# Patient Record
Sex: Male | Born: 1960 | Race: Black or African American | Hispanic: No | Marital: Married | Smoking: Never smoker
Health system: Southern US, Community
[De-identification: ages and names within clinical notes are randomized; demographics above are authoritative.]

## PROBLEM LIST (undated history)

## (undated) DIAGNOSIS — I422 Other hypertrophic cardiomyopathy: Secondary | ICD-10-CM

## (undated) DIAGNOSIS — N529 Male erectile dysfunction, unspecified: Secondary | ICD-10-CM

## (undated) DIAGNOSIS — I10 Essential (primary) hypertension: Secondary | ICD-10-CM

## (undated) DIAGNOSIS — M109 Gout, unspecified: Secondary | ICD-10-CM

## (undated) DIAGNOSIS — M5116 Intervertebral disc disorders with radiculopathy, lumbar region: Secondary | ICD-10-CM

## (undated) DIAGNOSIS — E785 Hyperlipidemia, unspecified: Secondary | ICD-10-CM

## (undated) DIAGNOSIS — G4730 Sleep apnea, unspecified: Secondary | ICD-10-CM

## (undated) DIAGNOSIS — I77810 Thoracic aortic ectasia: Secondary | ICD-10-CM

## (undated) DIAGNOSIS — R011 Cardiac murmur, unspecified: Secondary | ICD-10-CM

## (undated) DIAGNOSIS — E288 Other ovarian dysfunction: Secondary | ICD-10-CM

## (undated) DIAGNOSIS — I2510 Atherosclerotic heart disease of native coronary artery without angina pectoris: Secondary | ICD-10-CM

## (undated) DIAGNOSIS — I5189 Other ill-defined heart diseases: Secondary | ICD-10-CM

## (undated) HISTORY — PX: CARDIAC CATHETERIZATION: SHX172

## (undated) HISTORY — PX: TONSILLECTOMY AND ADENOIDECTOMY: SUR1326

## (undated) HISTORY — DX: Other hypertrophic cardiomyopathy: I42.2

## (undated) HISTORY — PX: TONSILLECTOMY: SUR1361

## (undated) HISTORY — PX: CHOANAL ADENIODECTOMY: SHX1340

---

## 2004-04-08 ENCOUNTER — Inpatient Hospital Stay: Payer: Self-pay | Admitting: Otolaryngology

## 2010-05-01 ENCOUNTER — Ambulatory Visit: Payer: Self-pay | Admitting: Nurse Practitioner

## 2012-06-25 ENCOUNTER — Emergency Department: Payer: Self-pay | Admitting: Unknown Physician Specialty

## 2012-06-25 LAB — CBC
HCT: 45.6 % (ref 40.0–52.0)
HGB: 15.2 g/dL (ref 13.0–18.0)
MCH: 29.2 pg (ref 26.0–34.0)
MCV: 88 fL (ref 80–100)
WBC: 11.7 10*3/uL — ABNORMAL HIGH (ref 3.8–10.6)

## 2012-06-25 LAB — COMPREHENSIVE METABOLIC PANEL
Albumin: 4.1 g/dL (ref 3.4–5.0)
BUN: 17 mg/dL (ref 7–18)
Calcium, Total: 9.1 mg/dL (ref 8.5–10.1)
Chloride: 108 mmol/L — ABNORMAL HIGH (ref 98–107)
Co2: 26 mmol/L (ref 21–32)
EGFR (African American): >60
EGFR (Non-African Amer.): >60
Potassium: 3.6 mmol/L (ref 3.5–5.1)
SGOT(AST): 23 U/L (ref 15–37)

## 2012-06-25 LAB — URINALYSIS, COMPLETE
Bacteria: NONE SEEN
Bilirubin,UR: NEGATIVE
Glucose,UR: NEGATIVE mg/dL (ref 0–75)
Ketone: NEGATIVE
Nitrite: NEGATIVE
Ph: 5 (ref 4.5–8.0)
Protein: 30
RBC,UR: 1 /HPF (ref 0–5)
Specific Gravity: 1.027 (ref 1.003–1.030)
Squamous Epithelial: <1
WBC UR: 2 /HPF (ref 0–5)

## 2012-06-25 LAB — LIPASE, BLOOD: Lipase: 111 U/L (ref 73–393)

## 2014-03-16 DIAGNOSIS — I10 Essential (primary) hypertension: Secondary | ICD-10-CM

## 2014-03-16 HISTORY — DX: Essential (primary) hypertension: I10

## 2014-04-13 DIAGNOSIS — M109 Gout, unspecified: Secondary | ICD-10-CM

## 2014-04-13 HISTORY — DX: Gout, unspecified: M10.9

## 2014-05-14 DIAGNOSIS — N529 Male erectile dysfunction, unspecified: Secondary | ICD-10-CM

## 2014-05-14 HISTORY — DX: Male erectile dysfunction, unspecified: N52.9

## 2015-05-10 ENCOUNTER — Encounter: Payer: Self-pay | Admitting: *Deleted

## 2015-05-13 ENCOUNTER — Encounter
Admission: RE | Disposition: A | Discharge status: Home / Self Care | Payer: Self-pay | Source: Ambulatory Visit | Attending: Gastroenterology

## 2015-05-13 ENCOUNTER — Ambulatory Visit
Admission: RE | Admit: 2015-05-13 | Discharge: 2015-05-13 | Disposition: A | Discharge status: Home / Self Care | Payer: BC Managed Care – PPO | Source: Ambulatory Visit | Attending: Gastroenterology | Admitting: Gastroenterology

## 2015-05-13 ENCOUNTER — Ambulatory Visit: Payer: BC Managed Care – PPO | Admitting: Anesthesiology

## 2015-05-13 ENCOUNTER — Encounter: Payer: Self-pay | Admitting: Anesthesiology

## 2015-05-13 DIAGNOSIS — G4730 Sleep apnea, unspecified: Secondary | ICD-10-CM | POA: Diagnosis not present

## 2015-05-13 DIAGNOSIS — M109 Gout, unspecified: Secondary | ICD-10-CM | POA: Diagnosis not present

## 2015-05-13 DIAGNOSIS — Z8371 Family history of colonic polyps: Secondary | ICD-10-CM | POA: Insufficient documentation

## 2015-05-13 DIAGNOSIS — Z1211 Encounter for screening for malignant neoplasm of colon: Secondary | ICD-10-CM | POA: Insufficient documentation

## 2015-05-13 DIAGNOSIS — I10 Essential (primary) hypertension: Secondary | ICD-10-CM | POA: Insufficient documentation

## 2015-05-13 DIAGNOSIS — Z800 Family history of malignant neoplasm of digestive organs: Secondary | ICD-10-CM | POA: Insufficient documentation

## 2015-05-13 DIAGNOSIS — K640 First degree hemorrhoids: Secondary | ICD-10-CM | POA: Diagnosis not present

## 2015-05-13 HISTORY — DX: Essential (primary) hypertension: I10

## 2015-05-13 HISTORY — DX: Sleep apnea, unspecified: G47.30

## 2015-05-13 HISTORY — DX: Other ovarian dysfunction: E28.8

## 2015-05-13 HISTORY — DX: Gout, unspecified: M10.9

## 2015-05-13 HISTORY — PX: COLONOSCOPY WITH PROPOFOL: SHX5780

## 2015-05-13 HISTORY — DX: Male erectile dysfunction, unspecified: N52.9

## 2015-05-13 SURGERY — COLONOSCOPY WITH PROPOFOL
Anesthesia: General

## 2015-05-13 MED ORDER — FENTANYL CITRATE (PF) 100 MCG/2ML IJ SOLN
INTRAMUSCULAR | Status: DC | PRN
  Administered 2015-05-13: 50 ug via INTRAVENOUS

## 2015-05-13 MED ORDER — PROPOFOL 500 MG/50ML IV EMUL
INTRAVENOUS | Status: DC | PRN
  Administered 2015-05-13: 140 ug/kg/min via INTRAVENOUS

## 2015-05-13 MED ORDER — MIDAZOLAM HCL 5 MG/5ML IJ SOLN
INTRAMUSCULAR | Status: DC | PRN
  Administered 2015-05-13: 2 mg via INTRAVENOUS

## 2015-05-13 MED ORDER — PROPOFOL 10 MG/ML IV BOLUS
INTRAVENOUS | Status: DC | PRN
  Administered 2015-05-13: 70 mg via INTRAVENOUS

## 2015-05-13 MED ORDER — LIDOCAINE HCL (CARDIAC) 20 MG/ML IV SOLN
INTRAVENOUS | Status: DC | PRN
  Administered 2015-05-13: 40 mg via INTRAVENOUS

## 2015-05-13 MED ORDER — SODIUM CHLORIDE 0.9 % IV SOLN
INTRAVENOUS | Status: DC
  Administered 2015-05-13: 1000 mL via INTRAVENOUS

## 2015-05-13 MED ORDER — SODIUM CHLORIDE 0.9 % IV SOLN: INTRAVENOUS | Status: DC

## 2015-05-13 NOTE — Anesthesia Preprocedure Evaluation (Signed)
Anesthesia Evaluation  Patient identified by MRN, date of birth, ID band Patient awake    Reviewed: Allergy & Precautions, NPO status , Patient's Chart, lab work & pertinent test results, reviewed documented beta blocker date and time   Airway Mallampati: II  TM Distance: >3 FB     Dental  (+) Chipped   Pulmonary          Cardiovascular hypertension, Pt. on medications     Neuro/Psych    GI/Hepatic   Endo/Other    Renal/GU      Musculoskeletal   Abdominal   Peds  Hematology   Anesthesia Other Findings   Reproductive/Obstetrics                             Anesthesia Physical Anesthesia Plan  ASA: II  Anesthesia Plan: General   Post-op Pain Management:    Induction: Intravenous  Airway Management Planned: Nasal Cannula  Additional Equipment:   Intra-op Plan:   Post-operative Plan:   Informed Consent: I have reviewed the patients History and Physical, chart, labs and discussed the procedure including the risks, benefits and alternatives for the proposed anesthesia with the patient or authorized representative who has indicated his/her understanding and acceptance.     Plan Discussed with: CRNA  Anesthesia Plan Comments:         Anesthesia Quick Evaluation  

## 2015-05-13 NOTE — Transfer of Care (Signed)
Immediate Anesthesia Transfer of Care Note  Patient: Brady Johnson  Procedure(s) Performed: Procedure(s): COLONOSCOPY WITH PROPOFOL (N/A)  Patient Location: PACU  Anesthesia Type:General  Level of Consciousness: sedated  Airway & Oxygen Therapy: Patient Spontanous Breathing and Patient connected to nasal cannula oxygen  Post-op Assessment: Report given to RN and Post -op Vital signs reviewed and stable  Post vital signs: Reviewed and stable  Last Vitals:  Filed Vitals:   05/13/15 1103  BP: 150/99  Pulse: 70  Temp: 36 C  Resp: 16    Complications: No apparent anesthesia complications

## 2015-05-13 NOTE — Op Note (Signed)
[facility] Gastroenterology Patient Name: Brady Johnson Procedure Date: 05/13/2015 12:38 PM MRN: [id number] Account #: [id number] Date of Birth: 05-Sep-1960 Admit Type: Outpatient Age: 55 Room: [facility] ENDO ROOM 3 Gender: Male Note Status: Finalized Procedure:         Colonoscopy Indications:       Family history of colon cancer in a distant relative,                     Family history of colonic polyps in a first-degree relative Providers:         Christena Deem, MD Referring MD:      [name] (Referring MD) Medicines:         Monitored Anesthesia Care Complications:     No immediate complications. Procedure:         Pre-Anesthesia Assessment:                    - ASA Grade Assessment: II - A patient with mild systemic                     disease.                    After obtaining informed consent, the colonoscope was                     passed under direct vision. Throughout the procedure, the                     patient's blood pressure, pulse, and oxygen saturations                     were monitored continuously. The Colonoscope was                     introduced through the anus and advanced to the the cecum,                     identified by appendiceal orifice and ileocecal valve. The                     colonoscopy was performed without difficulty. The                     colonoscopy was performed with moderate difficulty due to                     significant looping. Successful completion of the                     procedure was aided by changing the patient to a supine                     position, changing the patient to a prone position and                     using manual pressure. The quality of the bowel                     preparation was good. Findings:      Non-bleeding internal hemorrhoids were found during anoscopy. The       hemorrhoids were Grade I (internal hemorrhoids that do not prolapse).      The retroflexed view of the  distal rectum and anal  verge was normal and       showed no anal or rectal abnormalities.      The exam was otherwise without abnormality. Impression:        - Non-bleeding internal hemorrhoids.                    - The distal rectum and anal verge are normal on                     retroflexion view.                    - The examination was otherwise normal.                    - No specimens collected. Recommendation:    - Discharge patient to home.                    - Repeat colonoscopy in 5 years for screening purposes. Procedure Code(s): --- Professional ---                    [id number], Colonoscopy, flexible; diagnostic, including                     collection of specimen(s) by brushing or washing, when                     performed (separate procedure) Diagnosis Code(s): --- Professional ---                    K64.0, First degree hemorrhoids                    Z80.0, Family history of malignant neoplasm of digestive                     organs                    Z83.71, Family history of colonic polyps CPT copyright 2014 [employer]. All rights reserved. The codes documented in this report are preliminary and upon coder review may  be revised to meet current compliance requirements. Christena Deem, MD 05/13/2015 1:15:33 PM This report has been signed electronically. Number of Addenda: 0 Note Initiated On: 05/13/2015 12:38 PM Scope Withdrawal Time: 0 hours 5 minutes 53 seconds  Total Procedure Duration: 0 hours 26 minutes 25 seconds       [facility]

## 2015-05-13 NOTE — Anesthesia Postprocedure Evaluation (Signed)
Anesthesia Post Note  Patient: Brady Johnson  Procedure(s) Performed: Procedure(s) (LRB): COLONOSCOPY WITH PROPOFOL (N/A)  Patient location during evaluation: Endoscopy Anesthesia Type: General Level of consciousness: awake Pain management: pain level controlled Vital Signs Assessment: post-procedure vital signs reviewed and stable Respiratory status: spontaneous breathing Cardiovascular status: blood pressure returned to baseline Anesthetic complications: no    Last Vitals:  Filed Vitals:   05/13/15 1340 05/13/15 1350  BP: 125/86 143/99  Pulse: 85 70  Temp:    Resp: 23 13    Last Pain: There were no vitals filed for this visit.               Tishia Maestre S

## 2015-05-13 NOTE — H&P (Signed)
Outpatient short stay form Pre-procedure 05/13/2015 12:37 PM Christena Deem MD  Primary Physician: [facility]  Reason for visit:  Colonoscopy  History of present illness:  Patient is a 55 year old male presenting today for screening colonoscopy. This is his first colonoscopy. There is a family history of colon polyps in his mother. Ulcerating maternal uncle who had colon cancer.  He tolerated his prep well. He takes no aspirin or blood thinning agents.    Current facility-administered medications:  .  0.9 %  sodium chloride infusion, , Intravenous, Continuous, Christena Deem, MD, Last Rate: 20 mL/hr at 05/13/15 1118, 1,000 mL at 05/13/15 1118 .  0.9 %  sodium chloride infusion, , Intravenous, Continuous, Christena Deem, MD  Prescriptions prior to admission  Medication Sig Dispense Refill Last Dose  . Colchicine 0.6 MG CAPS Take 1 tablet by mouth daily.     Marland Kitchen lisinopril (PRINIVIL,ZESTRIL) 30 MG tablet Take 30 mg by mouth daily.        No Known Allergies   Past Medical History  Diagnosis Date  . Hypertension   . Erectile dysfunction   . Hyperandrogenism   . Gout   . Sleep apnea     Review of systems:      Physical Exam    Heart and lungs: Regular rate and rhythm without rub or gallop, lungs are bilaterally clear.    HEENT: Normocephalic atraumatic eyes are anicteric    Other:     Pertinant exam for procedure: Soft nontender nondistended bowel sounds positive normoactive.    Planned proceedures: Colonoscopy with indicated procedures. I have discussed the risks benefits and complications of procedures to include not limited to bleeding, infection, perforation and the risk of sedation and the patient wishes to proceed.    Christena Deem, MD Gastroenterology 05/13/2015  12:37 PM

## 2015-05-14 ENCOUNTER — Encounter: Payer: Self-pay | Admitting: Gastroenterology

## 2016-04-13 DIAGNOSIS — E785 Hyperlipidemia, unspecified: Secondary | ICD-10-CM

## 2016-04-13 HISTORY — DX: Hyperlipidemia, unspecified: E78.5

## 2017-02-07 ENCOUNTER — Ambulatory Visit
Admission: EM | Admit: 2017-02-07 | Discharge: 2017-02-07 | Disposition: A | Discharge status: Home / Self Care | Payer: BC Managed Care – PPO | Attending: Family Medicine | Admitting: Family Medicine

## 2017-02-07 DIAGNOSIS — M109 Gout, unspecified: Secondary | ICD-10-CM | POA: Diagnosis not present

## 2017-02-07 DIAGNOSIS — M25572 Pain in left ankle and joints of left foot: Secondary | ICD-10-CM

## 2017-02-07 DIAGNOSIS — M79672 Pain in left foot: Secondary | ICD-10-CM | POA: Diagnosis not present

## 2017-02-07 MED ORDER — COLCHICINE 0.6 MG PO TABS: ORAL_TABLET | ORAL | 1 refills | Status: DC

## 2017-02-07 NOTE — ED Provider Notes (Addendum)
[facility] ____________________________________________  Time seen: Approximately 1000 AM  I have reviewed the triage vital signs and the nursing notes.   HISTORY  Chief Complaint Gout  HPI Brady Johnson is a 56 y.o. male presenting with wife at bedside for evaluation of left foot and ankle pain with associated swelling that started this past Friday.  Patient reports he has a long-standing history of gout with same presentation, and states that current pain and discomfort is consistent with his gout pain. Denies fall, injury or trauma.  Denies any insect bite or break in skin. States pain mild currently and worse with direct palpation. Reports consistent with his gout, he does currently have pain with ankle range of motion, but denies foot range of motion changes.  Denies paresthesias, pain radiation or other pain or injury.  Patient reports continues to eat and drink well.  Does denies cardiac history or renal insufficiency.  Patient reports in the past his gout is well treated with colchicine.  States he has been really eating some recent pork and seafood.  Denies alcohol use. Denies chest pain, shortness of breath, abdominal pain, or rash. Denies recent sickness. Denies recent antibiotic use.   Margorie John, MD: PCP   Past Medical History:  Diagnosis Date  . Erectile dysfunction   . Gout   . Hyperandrogenism   . Hypertension   . Sleep apnea     There are no active problems to display for this patient.   Past Surgical History:  Procedure Laterality Date  . CHOANAL ADENIODECTOMY    . COLONOSCOPY WITH PROPOFOL N/A 05/13/2015   Procedure: COLONOSCOPY WITH PROPOFOL;  Surgeon: Christena Deem, MD;  Location: [facility] ENDOSCOPY;  Service: Endoscopy;  Laterality: N/A;  . TONSILLECTOMY    . TONSILLECTOMY AND ADENOIDECTOMY       No current facility-administered medications for this encounter.   Current Outpatient Prescriptions:  .  Colchicine 0.6 MG CAPS, Take 1  tablet by mouth daily., Disp: , Rfl:  .  lisinopril (PRINIVIL,ZESTRIL) 30 MG tablet, Take 30 mg by mouth daily., Disp: , Rfl:  .  colchicine 0.6 MG tablet, Take two tablets 1.2 mg orally once, then take 0.6 mg orally one hour later., Disp: 3 tablet, Rfl: 1  Allergies Patient has no known allergies.  family history Father: gout  Social History Social History  Substance Use Topics  . Smoking status: Never Smoker  . Smokeless tobacco: Never Used  . Alcohol use No    Review of Systems Constitutional: No fever/chills Cardiovascular: Denies chest pain. Respiratory: Denies shortness of breath. Gastrointestinal: No abdominal pain.  Musculoskeletal: Negative for back pain. AS above.  Skin: Negative for rash. Neurological: Negative for headaches, focal weakness or numbness.   ____________________________________________   PHYSICAL EXAM:  VITAL SIGNS: ED Triage Vitals  Enc Vitals Group     BP 02/07/17 0904 137/84     Pulse Rate 02/07/17 0904 73     Resp 02/07/17 0904 18     Temp 02/07/17 0904 98.6 F (37 C)     Temp Source 02/07/17 0904 Oral     SpO2 02/07/17 0904 100 %     Weight 02/07/17 0902 216 lb (98 kg)     Height 02/07/17 0902 5\' 8"  (1.727 m)     Head Circumference --      Peak Flow --      Pain Score 02/07/17 0902 9     Pain Loc --      Pain Edu? --  Excl. in GC? --     Constitutional: Alert and oriented. Well appearing and in no acute distress. Cardiovascular: Normal rate, regular rhythm. Grossly normal heart sounds.  Good peripheral circulation. Respiratory: Normal respiratory effort without tachypnea nor retractions. Breath sounds are clear and equal bilaterally. No wheezes, rales, rhonchi. Musculoskeletal: Steady gait. Bilateral pedal pulses equal and easily palpated. Except: Mild diffuse left ankle and dorsal foot swelling, minimal erythema, no point bony tenderness, skin intact, diffuse tenderness, slightly limited ankle rotation, full range of motion  present in foot, normal distal sensation and capillary refill, left lower extremity otherwise nontender.  Ambulatory with steady gait.  Neurologic:  Normal speech and language. No gross focal neurologic deficits are appreciated. Speech is normal. No gait instability.  Skin:  Skin is warm, dry and intact. No rash noted. Psychiatric: Mood and affect are normal. Speech and behavior are normal. Patient exhibits appropriate insight and judgment   ___________________________________________   LABS (all labs ordered are listed, but only abnormal results are displayed)  Labs Reviewed - No data to display ____________________________________________  PROCEDURES Procedures   INITIAL IMPRESSION / ASSESSMENT AND PLAN / ED COURSE  Pertinent labs & imaging results that were available during my care of the patient were reviewed by me and considered in my medical decision making (see chart for details).  Very well-appearing patient.  No acute distress.  History of gout with similar presentation.  Successful treatment previous with colchicine.  Will treat patient with colchicine 1.2 mg initially, followed by 0.6mg .  Encourage rest, fluids, supportive care, avoidance of dietary triggers. Discussed indication, risks and benefits of medications with patient.  Discussed follow up and return parameters including no resolution or any worsening concerns. Patient verbalized understanding and agreed to plan.   ____________________________________________   FINAL CLINICAL IMPRESSION(S) / ED DIAGNOSES  Final diagnoses:  Acute gout of left foot, unspecified cause     Discharge Medication List as of 02/07/2017  9:28 AM    START taking these medications   Details  colchicine 0.6 MG tablet Take two tablets 1.2 mg orally once, then take 0.6 mg orally one hour later., Normal        Note: This dictation was prepared with Dragon dictation along with smaller phrase technology. Any transcriptional errors that  result from this process are unintentional.         Renford DillsMiller, Zachary Nole, NP 02/07/17 1114    Renford DillsMiller, Nana Vastine, NP 02/07/17 1114

## 2017-02-07 NOTE — Discharge Instructions (Signed)
Take medication as prescribed. Rest. Drink plenty of fluids.  ° °Follow up with your primary care physician this week as needed. Return to Urgent care for new or worsening concerns.  ° °

## 2017-02-07 NOTE — ED Triage Notes (Signed)
Patient complains of gout in left foot. Patient states that he has a history of this and has had to have colcrys in the past. Patient states that he noticed symptoms Friday.

## 2019-03-29 ENCOUNTER — Encounter: Payer: Self-pay | Admitting: Intensive Care

## 2019-03-29 ENCOUNTER — Encounter
Admission: EM | Disposition: A | Discharge status: Home / Self Care | Payer: Self-pay | Source: Home / Self Care | Attending: Internal Medicine

## 2019-03-29 ENCOUNTER — Other Ambulatory Visit: Payer: Self-pay

## 2019-03-29 ENCOUNTER — Inpatient Hospital Stay
Admission: EM | Admit: 2019-03-29 | Discharge: 2019-03-30 | DRG: 287 | Disposition: A | Discharge status: Home / Self Care | Payer: BC Managed Care – PPO | Attending: Internal Medicine | Admitting: Internal Medicine

## 2019-03-29 ENCOUNTER — Emergency Department: Payer: BC Managed Care – PPO

## 2019-03-29 DIAGNOSIS — Z79899 Other long term (current) drug therapy: Secondary | ICD-10-CM | POA: Diagnosis not present

## 2019-03-29 DIAGNOSIS — I259 Chronic ischemic heart disease, unspecified: Secondary | ICD-10-CM | POA: Diagnosis present

## 2019-03-29 DIAGNOSIS — I10 Essential (primary) hypertension: Secondary | ICD-10-CM | POA: Diagnosis present

## 2019-03-29 DIAGNOSIS — R079 Chest pain, unspecified: Secondary | ICD-10-CM

## 2019-03-29 DIAGNOSIS — Z8249 Family history of ischemic heart disease and other diseases of the circulatory system: Secondary | ICD-10-CM | POA: Diagnosis not present

## 2019-03-29 DIAGNOSIS — M109 Gout, unspecified: Secondary | ICD-10-CM | POA: Diagnosis present

## 2019-03-29 DIAGNOSIS — Z9119 Patient's noncompliance with other medical treatment and regimen: Secondary | ICD-10-CM | POA: Diagnosis not present

## 2019-03-29 DIAGNOSIS — G4733 Obstructive sleep apnea (adult) (pediatric): Secondary | ICD-10-CM | POA: Diagnosis present

## 2019-03-29 DIAGNOSIS — K219 Gastro-esophageal reflux disease without esophagitis: Secondary | ICD-10-CM | POA: Diagnosis present

## 2019-03-29 DIAGNOSIS — R0789 Other chest pain: Principal | ICD-10-CM | POA: Diagnosis present

## 2019-03-29 DIAGNOSIS — Z20828 Contact with and (suspected) exposure to other viral communicable diseases: Secondary | ICD-10-CM | POA: Diagnosis present

## 2019-03-29 DIAGNOSIS — E782 Mixed hyperlipidemia: Secondary | ICD-10-CM | POA: Diagnosis present

## 2019-03-29 DIAGNOSIS — E7849 Other hyperlipidemia: Secondary | ICD-10-CM | POA: Diagnosis not present

## 2019-03-29 DIAGNOSIS — E785 Hyperlipidemia, unspecified: Secondary | ICD-10-CM | POA: Diagnosis present

## 2019-03-29 HISTORY — DX: Hyperlipidemia, unspecified: E78.5

## 2019-03-29 HISTORY — PX: LEFT HEART CATH AND CORONARY ANGIOGRAPHY: CATH118249

## 2019-03-29 LAB — BASIC METABOLIC PANEL
Anion gap: 8 (ref 5–15)
BUN: 13 mg/dL (ref 6–20)
CO2: 24 mmol/L (ref 22–32)
Calcium: 9.8 mg/dL (ref 8.9–10.3)
Chloride: 109 mmol/L (ref 98–111)
Creatinine, Ser: 1.12 mg/dL (ref 0.61–1.24)
GFR calc Af Amer: >60 mL/min (ref >60)
GFR calc non Af Amer: >60 mL/min (ref >60)
Glucose, Bld: 102 mg/dL — ABNORMAL HIGH (ref 70–99)
Potassium: 4.1 mmol/L (ref 3.5–5.1)
Sodium: 141 mmol/L (ref 135–145)

## 2019-03-29 LAB — CBC
HCT: 45.6 % (ref 39.0–52.0)
Hemoglobin: 15.3 g/dL (ref 13.0–17.0)
MCH: 30.3 pg (ref 26.0–34.0)
MCHC: 33.6 g/dL (ref 30.0–36.0)
MCV: 90.3 fL (ref 80.0–100.0)
Platelets: 231 10*3/uL (ref 150–400)
RBC: 5.05 MIL/uL (ref 4.22–5.81)
RDW: 13 % (ref 11.5–15.5)
WBC: 7.2 10*3/uL (ref 4.0–10.5)
nRBC: 0 % (ref 0.0–0.2)

## 2019-03-29 LAB — TROPONIN I (HIGH SENSITIVITY)
Troponin I (High Sensitivity): 13 ng/L (ref <18)
Troponin I (High Sensitivity): 14 ng/L (ref <18)
Troponin I (High Sensitivity): 14 ng/L (ref <18)
Troponin I (High Sensitivity): 16 ng/L (ref <18)

## 2019-03-29 LAB — GLUCOSE, CAPILLARY: Glucose-Capillary: 109 mg/dL — ABNORMAL HIGH (ref 70–99)

## 2019-03-29 LAB — RESPIRATORY PANEL BY RT PCR (FLU A&B, COVID)
Influenza A by PCR: NEGATIVE
Influenza B by PCR: NEGATIVE
SARS Coronavirus 2 by RT PCR: NEGATIVE

## 2019-03-29 LAB — HEPARIN LEVEL (UNFRACTIONATED): Heparin Unfractionated: <0.1 IU/mL — ABNORMAL LOW (ref 0.30–0.70)

## 2019-03-29 LAB — MRSA PCR SCREENING: MRSA by PCR: NEGATIVE

## 2019-03-29 LAB — APTT: aPTT: 26 seconds (ref 24–36)

## 2019-03-29 LAB — PROTIME-INR
INR: 1 (ref 0.8–1.2)
Prothrombin Time: 12.6 seconds (ref 11.4–15.2)

## 2019-03-29 SURGERY — LEFT HEART CATH AND CORONARY ANGIOGRAPHY
Anesthesia: Moderate Sedation

## 2019-03-29 MED ORDER — SODIUM CHLORIDE 0.9 % WEIGHT BASED INFUSION: 3.0000 mL/kg/h | INTRAVENOUS | Status: DC

## 2019-03-29 MED ORDER — HYDRALAZINE HCL 50 MG PO TABS
25.0000 mg | ORAL_TABLET | Freq: Three times a day (TID) | ORAL | Status: DC | PRN
  Administered 2019-03-30: 25 mg via ORAL
  Filled 2019-03-29: qty 1

## 2019-03-29 MED ORDER — IOHEXOL 300 MG/ML  SOLN
INTRAMUSCULAR | Status: DC | PRN
  Administered 2019-03-29: 145 mL

## 2019-03-29 MED ORDER — LABETALOL HCL 5 MG/ML IV SOLN: 10.0000 mg | INTRAVENOUS | Status: AC | PRN

## 2019-03-29 MED ORDER — SODIUM CHLORIDE 0.9 % WEIGHT BASED INFUSION: 1.0000 mL/kg/h | INTRAVENOUS | Status: DC

## 2019-03-29 MED ORDER — SODIUM CHLORIDE 0.9 % IV SOLN: 250.0000 mL | INTRAVENOUS | Status: DC | PRN

## 2019-03-29 MED ORDER — ACETAMINOPHEN 325 MG PO TABS: 650.0000 mg | ORAL_TABLET | ORAL | Status: DC | PRN

## 2019-03-29 MED ORDER — ASPIRIN 81 MG PO CHEW
324.0000 mg | CHEWABLE_TABLET | Freq: Every day | ORAL | Status: DC
  Administered 2019-03-30: 324 mg via ORAL
  Filled 2019-03-29: qty 4

## 2019-03-29 MED ORDER — SODIUM CHLORIDE 0.9% FLUSH: 3.0000 mL | INTRAVENOUS | Status: DC | PRN

## 2019-03-29 MED ORDER — HEPARIN BOLUS VIA INFUSION
4000.0000 [IU] | Freq: Once | INTRAVENOUS | Status: AC
  Administered 2019-03-29: 4000 [IU] via INTRAVENOUS
  Filled 2019-03-29: qty 4000

## 2019-03-29 MED ORDER — HYDRALAZINE HCL 20 MG/ML IJ SOLN: 10.0000 mg | INTRAMUSCULAR | Status: AC | PRN

## 2019-03-29 MED ORDER — ASPIRIN 81 MG PO CHEW: 81.0000 mg | CHEWABLE_TABLET | ORAL | Status: DC

## 2019-03-29 MED ORDER — PANTOPRAZOLE SODIUM 40 MG PO TBEC
40.0000 mg | DELAYED_RELEASE_TABLET | Freq: Every day | ORAL | Status: DC
  Administered 2019-03-30: 40 mg via ORAL
  Filled 2019-03-29: qty 1

## 2019-03-29 MED ORDER — SODIUM CHLORIDE 0.9% FLUSH
3.0000 mL | Freq: Two times a day (BID) | INTRAVENOUS | Status: DC
  Administered 2019-03-29 – 2019-03-30 (×2): 3 mL via INTRAVENOUS

## 2019-03-29 MED ORDER — NITROGLYCERIN 0.4 MG SL SUBL: 0.4000 mg | SUBLINGUAL_TABLET | SUBLINGUAL | Status: DC | PRN

## 2019-03-29 MED ORDER — FENTANYL CITRATE (PF) 100 MCG/2ML IJ SOLN
INTRAMUSCULAR | Status: DC | PRN
  Administered 2019-03-29: 50 ug via INTRAVENOUS

## 2019-03-29 MED ORDER — ONDANSETRON HCL 4 MG/2ML IJ SOLN: 4.0000 mg | Freq: Four times a day (QID) | INTRAMUSCULAR | Status: DC | PRN

## 2019-03-29 MED ORDER — SODIUM CHLORIDE 0.9% FLUSH
3.0000 mL | Freq: Two times a day (BID) | INTRAVENOUS | Status: DC
  Administered 2019-03-30 (×2): 3 mL via INTRAVENOUS

## 2019-03-29 MED ORDER — SODIUM CHLORIDE 0.9 % IV SOLN: INTRAVENOUS | Status: DC

## 2019-03-29 MED ORDER — HEPARIN (PORCINE) 25000 UT/250ML-% IV SOLN
1050.0000 [IU]/h | INTRAVENOUS | Status: DC
  Administered 2019-03-29: 1050 [IU]/h via INTRAVENOUS
  Filled 2019-03-29: qty 250

## 2019-03-29 MED ORDER — FENTANYL CITRATE (PF) 100 MCG/2ML IJ SOLN
INTRAMUSCULAR | Status: AC
  Filled 2019-03-29: qty 2

## 2019-03-29 MED ORDER — ASPIRIN 81 MG PO CHEW
324.0000 mg | CHEWABLE_TABLET | Freq: Once | ORAL | Status: AC
  Administered 2019-03-29: 324 mg via ORAL
  Filled 2019-03-29: qty 4

## 2019-03-29 MED ORDER — VERAPAMIL HCL 2.5 MG/ML IV SOLN
INTRAVENOUS | Status: DC | PRN
  Administered 2019-03-29: 2.5 mg via INTRA_ARTERIAL

## 2019-03-29 MED ORDER — HEPARIN (PORCINE) IN NACL 1000-0.9 UT/500ML-% IV SOLN
INTRAVENOUS | Status: AC
  Filled 2019-03-29: qty 1000

## 2019-03-29 MED ORDER — HEPARIN SODIUM (PORCINE) 1000 UNIT/ML IJ SOLN
INTRAMUSCULAR | Status: AC
  Filled 2019-03-29: qty 1

## 2019-03-29 MED ORDER — ATORVASTATIN CALCIUM 20 MG PO TABS: 40.0000 mg | ORAL_TABLET | Freq: Every day | ORAL | Status: DC

## 2019-03-29 MED ORDER — HEPARIN (PORCINE) IN NACL 2000-0.9 UNIT/L-% IV SOLN
INTRAVENOUS | Status: DC | PRN
  Administered 2019-03-29: 500 mL

## 2019-03-29 MED ORDER — LOSARTAN POTASSIUM 50 MG PO TABS
100.0000 mg | ORAL_TABLET | Freq: Every day | ORAL | Status: DC
  Administered 2019-03-30: 100 mg via ORAL
  Filled 2019-03-29: qty 2

## 2019-03-29 MED ORDER — MORPHINE SULFATE (PF) 2 MG/ML IV SOLN: 2.0000 mg | INTRAVENOUS | Status: DC | PRN

## 2019-03-29 MED ORDER — LABETALOL HCL 5 MG/ML IV SOLN
10.0000 mg | Freq: Once | INTRAVENOUS | Status: AC
  Administered 2019-03-29: 10 mg via INTRAVENOUS

## 2019-03-29 MED ORDER — LABETALOL HCL 5 MG/ML IV SOLN
INTRAVENOUS | Status: AC
  Filled 2019-03-29: qty 4

## 2019-03-29 MED ORDER — HEPARIN BOLUS VIA INFUSION
4000.0000 [IU] | Freq: Once | INTRAVENOUS | Status: DC
  Filled 2019-03-29: qty 4000

## 2019-03-29 MED ORDER — HEPARIN SODIUM (PORCINE) 1000 UNIT/ML IJ SOLN
INTRAMUSCULAR | Status: DC | PRN
  Administered 2019-03-29: 4800 [IU] via INTRAVENOUS

## 2019-03-29 MED ORDER — VERAPAMIL HCL 2.5 MG/ML IV SOLN
INTRAVENOUS | Status: AC
  Filled 2019-03-29: qty 2

## 2019-03-29 MED ORDER — HEPARIN (PORCINE) 25000 UT/250ML-% IV SOLN: 1050.0000 [IU]/h | INTRAVENOUS | Status: DC

## 2019-03-29 MED ORDER — MIDAZOLAM HCL 2 MG/2ML IJ SOLN
INTRAMUSCULAR | Status: AC
  Filled 2019-03-29: qty 2

## 2019-03-29 MED ORDER — SODIUM CHLORIDE 0.9 % WEIGHT BASED INFUSION
1.0000 mL/kg/h | INTRAVENOUS | Status: AC
  Administered 2019-03-29 – 2019-03-30 (×2): 1 mL/kg/h via INTRAVENOUS

## 2019-03-29 MED ORDER — MIDAZOLAM HCL 2 MG/2ML IJ SOLN
INTRAMUSCULAR | Status: DC | PRN
  Administered 2019-03-29: 1 mg via INTRAVENOUS

## 2019-03-29 SURGICAL SUPPLY — 9 items
CATH 5F 110X4 TIG (CATHETERS) ×1 IMPLANT
CATH INFINITI 5 FR JL3.5 (CATHETERS) ×1 IMPLANT
CATH INFINITI 5FR JL4 (CATHETERS) ×1 IMPLANT
CATH INFINITI JR4 5F (CATHETERS) ×1 IMPLANT
DEVICE RAD TR BAND REGULAR (VASCULAR PRODUCTS) ×1 IMPLANT
GLIDESHEATH SLEND SS 6F .021 (SHEATH) ×1 IMPLANT
KIT MANI 3VAL PERCEP (MISCELLANEOUS) ×2 IMPLANT
PACK CARDIAC CATH (CUSTOM PROCEDURE TRAY) ×2 IMPLANT
WIRE ROSEN-J .035X260CM (WIRE) ×1 IMPLANT

## 2019-03-29 NOTE — Consult Note (Addendum)
[place] for Heprin Indication: ACS / STEMI  No Known Allergies  Patient Measurements: Height: 5\' 8"  (172.7 cm) Weight: 210 lb (95.3 kg) IBW/kg (Calculated) : 68.4 Heparin Dosing Weight: 88.4 kg   Vital Signs: Temp: 98.7 F (37.1 C) (12/16 1123) Temp Source: Oral (12/16 1123) BP: 171/99 (12/16 1123) Pulse Rate: 75 (12/16 1123)  Labs: Recent Labs    03/29/19 1153  HGB 15.3  HCT 45.6  PLT 231    CrCl cannot be calculated (Patient's most recent lab result is older than the maximum 21 days allowed.).   Medications:  Confirmed no PTA anticoagulants   Assessment: Pharmacy has been consulted for heparin dosing for ACS. Patient presented to ED with c/o burning chest discomfort that does not radiated.   Baseline Hgb appropriated and BMP pending. Will need to obtain other BL labs.  Troponin pending     Goal of Therapy:  Heparin level 0.3-0.7 units/ml Monitor platelets by anticoagulation protocol: Yes   Plan:  Baseline labs have been ordered  Heparin DW: 88.4 kg  Give 4000 units bolus x 1 Start heparin infusion at 1050 units/hr Check anti-Xa level in 6 hours and daily while on heparin, per protocol Continue to monitor H&H and platelets  Update: Messaged Dr. Clayborn Bigness about heparin utility. Discontinue heparin, per Dr. Clayborn Bigness.   Rowland Lathe 03/29/2019,12:18 PM

## 2019-03-29 NOTE — Progress Notes (Signed)
Patient clinically stable post cardiac cath per Dr Clayborn Bigness, right radial TR band intact, no bleeding nor hematoma at site. Taking po;'s without difficulty.. Dr Clayborn Bigness called and spoke with wife post procedure with questions answered. Given labetolol 10mg  iv for bp with improvement noted. Sinus rhythm per monitor. Awaiting ICU bed for overnight observation.

## 2019-03-29 NOTE — H&P (Addendum)
History and Physical    JAYDIN BONIFACE FYB:[id number] DOB: July 15, 1960 DOA: 03/29/2019  Referring MD/NP/PA:   PCP: Margorie John, MD   Patient coming from:  The patient is coming from home.  At baseline, pt is independent for most of ADL.        Chief Complaint: Chest pain  HPI: Brady Johnson is a 58 y.o. male with medical history significant of HTN, HLD, gout, OSA not on CPAP, who presents with chest pain.  Pt states that he had chest pain last night which is located in substernal area, burning-like-like, 2 out of 10 severity, nonradiating. Currently his chest pain has resolved. Denies shortness of breath, cough, fever or chills.  No nausea vomiting, diarrhea, abdominal pain, symptoms of UTI or unilateral weakness. He states that he had labs drawn yesterday and was told to come to the ED for an "abnormal heart lab" int he 500s.  ED Course: pt was found to have trop 13, INR 1.0, PTT 26, WBC 7.2, creatinine 1.12, BUN 13, GFR>60, temperature normal, elevated blood pressure 171/99, heart rate 70s, oxygen saturation 97% on room air.  Chest x-ray negative.  EKG has diffused T wave inversion. Pt is admitted to tele bed as inpt. Card, Dr. Juliann Pares was consulted by EDP.  Review of Systems:   General: no fevers, chills, no body weight gain, no fatigue HEENT: no blurry vision, hearing changes or sore throat Respiratory: no dyspnea, coughing, wheezing CV: has chest pain, no palpitations GI: no nausea, vomiting, abdominal pain, diarrhea, constipation GU: no dysuria, burning on urination, increased urinary frequency, hematuria  Ext: no leg edema Neuro: no unilateral weakness, numbness, or tingling, no vision change or hearing loss Skin: no rash, no skin tear. MSK: No muscle spasm, no deformity, no limitation of range of movement in spin Heme: No easy bruising.  Travel history: No recent long distant travel.  Allergy: No Known Allergies  Past Medical History:  Diagnosis Date  .  Erectile dysfunction   . Gout   . Hyperandrogenism   . Hyperlipidemia   . Hypertension   . Sleep apnea     Past Surgical History:  Procedure Laterality Date  . CHOANAL ADENIODECTOMY    . COLONOSCOPY WITH PROPOFOL N/A 05/13/2015   Procedure: COLONOSCOPY WITH PROPOFOL;  Surgeon: Christena Deem, MD;  Location: [facility] ENDOSCOPY;  Service: Endoscopy;  Laterality: N/A;  . TONSILLECTOMY    . TONSILLECTOMY AND ADENOIDECTOMY      Social History:  reports that he has never smoked. He has never used smokeless tobacco. He reports that he does not drink alcohol or use drugs.  Family History:  Family History  Problem Relation Age of Onset  . Heart attack Father      Prior to Admission medications   Medication Sig Start Date End Date Taking? Authorizing Provider  Colchicine 0.6 MG CAPS Take 1 tablet by mouth daily.    [provider]  colchicine 0.6 MG tablet Take two tablets 1.2 mg orally once, then take 0.6 mg orally one hour later. 02/07/17   Renford Dills, NP  lisinopril (PRINIVIL,ZESTRIL) 30 MG tablet Take 30 mg by mouth daily.    [provider]    Physical Exam: Vitals:   03/29/19 1123 03/29/19 1200 03/29/19 1230 03/29/19 1300  BP: (!) 171/99 (!) 156/101 (!) 164/101 (!) 183/108  Pulse: 75 72 71 73  Resp: 14 20 (!) 21 18  Temp: 98.7 F (37.1 C)     TempSrc: Oral  SpO2: 100% 97% 98% 100%  Weight:      Height:       General: Not in acute distress HEENT:       Eyes: PERRL, EOMI, no scleral icterus.       ENT: No discharge from the ears and nose, no pharynx injection, no tonsillar enlargement.        Neck: No JVD, no bruit, no mass felt. Heme: No neck lymph node enlargement. Cardiac: S1/S2, RRR, No murmurs, No gallops or rubs. Respiratory: No rales, wheezing, rhonchi or rubs. GI: Soft, nondistended, nontender, no rebound pain, no organomegaly, BS present. GU: No hematuria Ext: No pitting leg edema bilaterally. 2+DP/PT pulse  bilaterally. Musculoskeletal: No joint deformities, No joint redness or warmth, no limitation of ROM in spin. Skin: No rashes.  Neuro: Alert, oriented X3, cranial nerves II-XII grossly intact, moves all extremities normally.  Psych: Patient is not psychotic, no suicidal or hemocidal ideation.  Labs on Admission: I have personally reviewed following labs and imaging studies  CBC: Recent Labs  Lab 03/29/19 1153  WBC 7.2  HGB 15.3  HCT 45.6  MCV 90.3  PLT 433   Basic Metabolic Panel: Recent Labs  Lab 03/29/19 1153  NA 141  K 4.1  CL 109  CO2 24  GLUCOSE 102*  BUN 13  CREATININE 1.12  CALCIUM 9.8   GFR: Estimated Creatinine Clearance: 81.5 mL/min (by C-G formula based on SCr of 1.12 mg/dL). Liver Function Tests: No results for input(s): AST, ALT, ALKPHOS, BILITOT, PROT, ALBUMIN in the last 168 hours. No results for input(s): LIPASE, AMYLASE in the last 168 hours. No results for input(s): AMMONIA in the last 168 hours. Coagulation Profile: Recent Labs  Lab 03/29/19 1153  INR 1.0   Cardiac Enzymes: No results for input(s): CKTOTAL, CKMB, CKMBINDEX, TROPONINI in the last 168 hours. BNP (last 3 results) No results for input(s): PROBNP in the last 8760 hours. HbA1C: No results for input(s): HGBA1C in the last 72 hours. CBG: No results for input(s): GLUCAP in the last 168 hours. Lipid Profile: No results for input(s): CHOL, HDL, LDLCALC, TRIG, CHOLHDL, LDLDIRECT in the last 72 hours. Thyroid Function Tests: No results for input(s): TSH, T4TOTAL, FREET4, T3FREE, THYROIDAB in the last 72 hours. Anemia Panel: No results for input(s): VITAMINB12, FOLATE, FERRITIN, TIBC, IRON, RETICCTPCT in the last 72 hours. Urine analysis:    Component Value Date/Time   COLORURINE Yellow 06/25/2012 1655   APPEARANCEUR Hazy 06/25/2012 1655   LABSPEC 1.027 06/25/2012 1655   PHURINE 5.0 06/25/2012 1655   GLUCOSEU Negative 06/25/2012 1655   HGBUR 1+ 06/25/2012 1655   BILIRUBINUR  Negative 06/25/2012 1655   KETONESUR Negative 06/25/2012 1655   PROTEINUR 30 mg/dL 06/25/2012 1655   NITRITE Negative 06/25/2012 1655   LEUKOCYTESUR Negative 06/25/2012 1655   Sepsis Labs: @LABRCNTIP (procalcitonin:4,lacticidven:4) )No results found for this or any previous visit (from the past 240 hour(s)).   Radiological Exams on Admission: DG Chest 2 View  Result Date: 03/29/2019 CLINICAL DATA:  Chest pain. EXAM: CHEST - 2 VIEW COMPARISON:  None. FINDINGS: The heart size and mediastinal contours are within normal limits. Both lungs are clear. No pneumothorax or pleural effusion is noted. The visualized skeletal structures are unremarkable. IMPRESSION: No active cardiopulmonary disease. Electronically Signed   By: Marijo Conception M.D.   On: 03/29/2019 11:45     EKG: Independently reviewed.  Sinus rhythm, QTC 461, LAE, deep T wave inversion diffusely except for lead III, ST elevation only in lead  V1   Assessment/Plan Principal Problem:   Chest pain Active Problems:   Hypertension   Gout   Hyperlipidemia   Chest pain: pt has diffused deep T wave inversion on EKG, concerning for ischemia. Dr. Juliann Paresallwood of card was consulted by EDP, likely cardiac to do cath today or tomorrow.  Dr. Juliann Paresallwood recommended to start patient on aspirin and IV heparin.  - will admit to tele bed as inpt - IV heparin - keep pt npo now - Trend Trop - Repeat EKG in the am  - prn Nitroglycerin, Morphine - Aspirin - switch zocor to lipitor 40 mg daily - Risk factor stratification: will check FLP and A1C  - check UDS - 2d echo - f/u Card further recommendations - start protonix 40 mg daily empirically for possible GERD given burning-like pain  HTN:  -Continue home medications: cozarr -IV hydralazine prn  Gout: -on prn colchicine (not taking colchicine currently)  Hyperlipidemia: -lipitor  Inpatient status:  # Patient requires inpatient status due to high intensity of service, high risk for  further deterioration and high frequency of surveillance required.  I certify that at the point of admission it is my clinical judgment that the patient will require inpatient hospital care spanning beyond 2 midnights from the point of admission.  . This patient has multiple chronic comorbidities including HTN, HLD, gout, OSA not on CPAP . Now patient has presenting with chest pain with diffuse TWI on EKG . The initial radiographic and laboratory data are worrisome because of diffused T wave inversion on EKG . Current medical needs: please see my assessment and plan . Predictability of an adverse outcome (risk): Patient's multiple comorbidities, now presents with chest pain with diffused T wave inversion on EKG, indicating ischemic change.  Patient will likely need cardiac catheterization.  His presentation is highly complicated. Pt is at high risk for deterioration. Need to be treated in hospital for at least 2 days.          DVT ppx: on IV Heparin Code Status: Full code Family Communication: None at bed side.     Disposition Plan:  Anticipate discharge back to previous home environment Consults called:  Card, Dr. Juliann Paresallwood Admission status: Inpatient/tele     Date of Service 03/29/2019    Lorretta HarpXilin Daundre Biel [facility]   If 7PM-7AM, please contact night-coverage [email or web address] Password [email or web address] 03/29/2019, 1:48 PM

## 2019-03-29 NOTE — Consult Note (Signed)
CARDIOLOGY CONSULT NOTE               Patient ID: Brady Johnson MRN: [id number] DOB/AGE: 17-May-1960 58 y.o.  Admit date: 03/29/2019 Referring Physician Dr. Lorretta Harp hospitalist Primary Physician Dr. Doreen Salvage Primary Cardiologist none Reason for Consultation possible non-STEMI possible acute coronary syndrome abnormal EKG  HPI: Patient is a 58 year old black male with poorly controlled hypertension hyperlipidemia gout obstructive sleep apnea recent mid chest and epigastric discomfort.  Patient went to urgent care yesterday for evaluation evidently had a troponin drawn and sent out and the results came back reportedly high so he was advised to come to the emergency room for admission and evaluation.  The patient has not had any other pain episodes since yesterday which was relatively mild and only lasted a short time he has had no exercise-induced dyspnea or angina denies any blackout spells or syncope no nausea vomiting.  Patient denies any previous cardiac history but has a significant family history of cardiac problems.  Patient may have had a functional study several years ago but nothing recently.  Patient reportedly was diagnosis of sleep apnea but has been noncompliant with CPAP  Review of systems complete and found to be negative unless listed above     Past Medical History:  Diagnosis Date  . Erectile dysfunction   . Gout   . Hyperandrogenism   . Hyperlipidemia   . Hypertension   . Sleep apnea     Past Surgical History:  Procedure Laterality Date  . CHOANAL ADENIODECTOMY    . COLONOSCOPY WITH PROPOFOL N/A 05/13/2015   Procedure: COLONOSCOPY WITH PROPOFOL;  Surgeon: Christena Deem, MD;  Location: [facility] ENDOSCOPY;  Service: Endoscopy;  Laterality: N/A;  . TONSILLECTOMY    . TONSILLECTOMY AND ADENOIDECTOMY      Medications Prior to Admission  Medication Sig Dispense Refill Last Dose  . colchicine 0.6 MG tablet Take 1 tablet by mouth daily as needed.    prn  at prn  . losartan (COZAAR) 100 MG tablet Take 100 mg by mouth daily.   03/29/2019 at 0800  . simvastatin (ZOCOR) 20 MG tablet Take 20 mg by mouth at bedtime.   03/29/2019 at 0800   Social History   Socioeconomic History  . Marital status: Married    Spouse name: Not on file  . Number of children: Not on file  . Years of education: Not on file  . Highest education level: Not on file  Occupational History  . Not on file  Tobacco Use  . Smoking status: Never Smoker  . Smokeless tobacco: Never Used  Substance and Sexual Activity  . Alcohol use: No  . Drug use: No  . Sexual activity: Not on file  Other Topics Concern  . Not on file  Social History Narrative  . Not on file   Social Determinants of Health   Financial Resource Strain:   . Difficulty of Paying Living Expenses: Not on file  Food Insecurity:   . Worried About Programme researcher, broadcasting/film/video in the Last Year: Not on file  . Ran Out of Food in the Last Year: Not on file  Transportation Needs:   . Lack of Transportation (Medical): Not on file  . Lack of Transportation (Non-Medical): Not on file  Physical Activity:   . Days of Exercise per Week: Not on file  . Minutes of Exercise per Session: Not on file  Stress:   . Feeling of Stress : Not on file  Social Connections:   . Frequency of Communication with Friends and Family: Not on file  . Frequency of Social Gatherings with Friends and Family: Not on file  . Attends Religious Services: Not on file  . Active Member of Clubs or Organizations: Not on file  . Attends Archivist Meetings: Not on file  . Marital Status: Not on file  Intimate Partner Violence:   . Fear of Current or Ex-Partner: Not on file  . Emotionally Abused: Not on file  . Physically Abused: Not on file  . Sexually Abused: Not on file    Family History  Problem Relation Age of Onset  . Heart attack Father       Review of systems complete and found to be negative unless listed above       PHYSICAL EXAM  General: Well developed, well nourished, in no acute distress HEENT:  Normocephalic and atramatic Neck:  No JVD.  Lungs: Clear bilaterally to auscultation and percussion. Heart: HRRR . Normal S1 and S2 without gallops or murmurs.  Abdomen: Bowel sounds are positive, abdomen soft and non-tender  Msk:  Back normal, normal gait. Normal strength and tone for 58 Extremities: No clubbing, cyanosis or edema.   Neuro: Alert and oriented X 3. Psych:  Good affect, responds appropriately  Labs:   Lab Results  Component Value Date   WBC 7.2 03/29/2019   HGB 15.3 03/29/2019   HCT 45.6 03/29/2019   MCV 90.3 03/29/2019   PLT 231 03/29/2019    Recent Labs  Lab 03/29/19 1153  NA 141  K 4.1  CL 109  CO2 24  BUN 13  CREATININE 1.12  CALCIUM 9.8  GLUCOSE 102*   No results found for: CKTOTAL, CKMB, CKMBINDEX, TROPONINI No results found for: CHOL No results found for: HDL No results found for: LDLCALC No results found for: TRIG No results found for: CHOLHDL No results found for: LDLDIRECT    Radiology: DG Chest 2 View  Result Date: 03/29/2019 CLINICAL DATA:  Chest pain. EXAM: CHEST - 2 VIEW COMPARISON:  None. FINDINGS: The heart size and mediastinal contours are within normal limits. Both lungs are clear. No pneumothorax or pleural effusion is noted. The visualized skeletal structures are unremarkable. IMPRESSION: No active cardiopulmonary disease. Electronically Signed   By: Marijo Conception M.D.   On: 03/29/2019 11:45   CARDIAC CATHETERIZATION  Result Date: 03/29/2019  Normal overall left ventricular function ejection fraction of 60%  Left dominant system  No significant obstructive coronary disease just minor irregularities  Conclusion Benign cardiac cath Normal left ventricular function Ejection fraction of 60% Successful right radial left heart cardiac cath    EKG: Normal sinus rhythm LVH diffuse ST changes inferior laterally no old EKG to  compare  ASSESSMENT AND PLAN:  Possible non-STEMI Possible acute coronary syndrome Abnormal EKG Poorly controlled hypertension Hyperlipidemia Obstructive sleep apnea History of gout . Plan Agree with admit to telemetry Follow-up EKGs and troponins Consider echocardiogram for further assessment evaluation Heparin intravenously to help with anticoagulation Continue statin therapy for lipid management Recommend CPAP for obstructive sleep apnea and compliance Proceed with cardiac cath within 24 hours for evaluation for possible non-STEMI acute coronary syndrome  Signed: Yolonda Kida MD, PHD, [facility] 03/29/2019, 5:27 PM

## 2019-03-29 NOTE — ED Provider Notes (Signed)
[facility] Emergency Department Provider Note  ____________________________________________   First MD Initiated Contact with Patient 03/29/19 1202     (approximate)  I have reviewed the triage vital signs and the nursing notes.   HISTORY  Chief Complaint Chest Pain    HPI Brady Johnson is a 58 y.o. male  With HTN, HLD, here with CP. Pt reports that he recently was seeing a new PCP for setting up insurance. He had labs drawn yesterday and was told to come to the ED for an "abnormal heart lab" int he 500s. He states he had some mild indigestion yesterday - burning, substernal pain that resolved, no SOB, no diaphoresis. No pain today. Denies poor exercise tolerance, DOE, or other recent issues. No fever, chills. Has been taking BP meds.       Past Medical History:  Diagnosis Date  . Erectile dysfunction   . Gout   . Hyperandrogenism   . Hyperlipidemia   . Hypertension   . Sleep apnea     Patient Active Problem List   Diagnosis Date Noted  . Hypertension   . Gout   . Hyperlipidemia   . Chest pain     Past Surgical History:  Procedure Laterality Date  . CHOANAL ADENIODECTOMY    . COLONOSCOPY WITH PROPOFOL N/A 05/13/2015   Procedure: COLONOSCOPY WITH PROPOFOL;  Surgeon: Lollie Sails, MD;  Location: [facility] ENDOSCOPY;  Service: Endoscopy;  Laterality: N/A;  . TONSILLECTOMY    . TONSILLECTOMY AND ADENOIDECTOMY      Prior to Admission medications   Medication Sig Start Date End Date Taking? Authorizing Provider  colchicine 0.6 MG tablet Take 1 tablet by mouth daily as needed.    Yes [provider]  losartan (COZAAR) 100 MG tablet Take 100 mg by mouth daily. 03/28/19  Yes [provider]  simvastatin (ZOCOR) 20 MG tablet Take 20 mg by mouth at bedtime. 01/20/19  Yes [provider]    Allergies Patient has no known allergies.  History reviewed. No pertinent family history.  Social History Social History    Tobacco Use  . Smoking status: Never Smoker  . Smokeless tobacco: Never Used  Substance Use Topics  . Alcohol use: No  . Drug use: No    Review of Systems  Review of Systems  Constitutional: Negative for chills, fatigue and fever.  HENT: Negative for sore throat.   Respiratory: Positive for chest tightness. Negative for shortness of breath.   Cardiovascular: Negative for chest pain.  Gastrointestinal: Negative for abdominal pain.  Genitourinary: Negative for flank pain.  Musculoskeletal: Negative for neck pain.  Skin: Negative for rash and wound.  Allergic/Immunologic: Negative for immunocompromised state.  Neurological: Negative for weakness and numbness.  Hematological: Does not bruise/bleed easily.     ____________________________________________  PHYSICAL EXAM:      VITAL SIGNS: ED Triage Vitals  Enc Vitals Group     BP 03/29/19 1123 (!) 171/99     Pulse Rate 03/29/19 1123 75     Resp 03/29/19 1123 14     Temp 03/29/19 1123 98.7 F (37.1 C)     Temp Source 03/29/19 1123 Oral     SpO2 03/29/19 1123 100 %     Weight 03/29/19 1120 210 lb (95.3 kg)     Height 03/29/19 1120 5\' 8"  (1.727 m)     Head Circumference --      Peak Flow --      Pain Score 03/29/19 1120 0  Pain Loc --      Pain Edu? --      Excl. in GC? --      Physical Exam Vitals and nursing note reviewed.  Constitutional:      General: He is not in acute distress.    Appearance: He is well-developed.  HENT:     Head: Normocephalic and atraumatic.  Eyes:     Conjunctiva/sclera: Conjunctivae normal.  Cardiovascular:     Rate and Rhythm: Normal rate and regular rhythm.     Heart sounds: Normal heart sounds. No murmur. No friction rub.  Pulmonary:     Effort: Pulmonary effort is normal. No respiratory distress.     Breath sounds: Normal breath sounds. No wheezing or rales.  Abdominal:     General: There is no distension.     Palpations: Abdomen is soft.     Tenderness: There is no  abdominal tenderness.  Musculoskeletal:     Cervical back: Neck supple.  Skin:    General: Skin is warm.     Capillary Refill: Capillary refill takes less than 2 seconds.  Neurological:     Mental Status: He is alert and oriented to person, place, and time.     Motor: No abnormal muscle tone.       ____________________________________________   LABS (all labs ordered are listed, but only abnormal results are displayed)  Labs Reviewed  BASIC METABOLIC PANEL - Abnormal; Notable for the following components:      Result Value   Glucose, Bld 102 (*)    All other components within normal limits  RESPIRATORY PANEL BY RT PCR (FLU A&B, COVID)  CBC  PROTIME-INR  APTT  HEPARIN LEVEL (UNFRACTIONATED)  URINE DRUG SCREEN, QUALITATIVE ([facility] ONLY)  HIV ANTIBODY (ROUTINE TESTING W REFLEX)  TROPONIN I (HIGH SENSITIVITY)  TROPONIN I (HIGH SENSITIVITY)    ____________________________________________  EKG: Normal sinus rhythm, VR 74. PR 90, QTc 461. Deep ST depression noted in lateral limb and precordial leads. No overt ST elevation. Concerning for ischemia. ________________________________________  RADIOLOGY All imaging, including plain films, CT scans, and ultrasounds, independently reviewed by me, and interpretations confirmed via formal radiology reads.  ED MD interpretation:   CXR: Clear  Official radiology report(s): DG Chest 2 View  Result Date: 03/29/2019 CLINICAL DATA:  Chest pain. EXAM: CHEST - 2 VIEW COMPARISON:  None. FINDINGS: The heart size and mediastinal contours are within normal limits. Both lungs are clear. No pneumothorax or pleural effusion is noted. The visualized skeletal structures are unremarkable. IMPRESSION: No active cardiopulmonary disease. Electronically Signed   By: Lupita RaiderJames  Green Jr M.D.   On: 03/29/2019 11:45    ____________________________________________  PROCEDURES   Procedure(s) performed (including Critical Care):  .Critical Care Performed  by: Shaune PollackIsaacs, Capers Hagmann, MD Authorized by: Shaune PollackIsaacs, Milad Bublitz, MD   Critical care provider statement:    Critical care time (minutes):  35   Critical care time was exclusive of:  Separately billable procedures and treating other patients and teaching time   Critical care was necessary to treat or prevent imminent or life-threatening deterioration of the following conditions:  Circulatory failure and cardiac failure   Critical care was time spent personally by me on the following activities:  Development of treatment plan with patient or surrogate, discussions with consultants, evaluation of patient's response to treatment, examination of patient, obtaining history from patient or surrogate, ordering and performing treatments and interventions, ordering and review of laboratory studies, ordering and review of radiographic studies, pulse oximetry, re-evaluation of  patient's condition and review of old charts   I assumed direction of critical care for this patient from another provider in my specialty: no      ____________________________________________  INITIAL IMPRESSION / MDM / ASSESSMENT AND PLAN / ED COURSE  As part of my medical decision making, I reviewed the following data within the electronic MEDICAL RECORD NUMBER Nursing notes reviewed and incorporated, Old chart reviewed, Notes from prior ED visits, and [place] Controlled Substance Database       *Brady Johnson was evaluated in Emergency Department on 03/29/2019 for the symptoms described in the history of present illness. He was evaluated in the context of the global COVID-19 pandemic, which necessitated consideration that the patient might be at risk for infection with the SARS-CoV-2 virus that causes COVID-19. Institutional protocols and algorithms that pertain to the evaluation of patients at risk for COVID-19 are in a state of rapid change based on information released by regulatory bodies including the CDC and federal and state organizations.  These policies and algorithms were followed during the patient's care in the ED.  Some ED evaluations and interventions may be delayed as a result of limited staffing during the pandemic.*     Medical Decision Making:  58 yo M here with dynamic EKG changes and deep ST depressions, concerning for ischemia. DDx includes LVH w/ repol with demand. No ST elevations. Dr. Juliann Pares consulted, will plan to take pt to OR today or tomorrow most likely. ASA, heparin gtt started. Admit to hospitalist.  ____________________________________________  FINAL CLINICAL IMPRESSION(S) / ED DIAGNOSES  Final diagnoses:  Cardiac ischemia  Atypical chest pain     MEDICATIONS GIVEN DURING THIS VISIT:  Medications  heparin ADULT infusion 100 units/mL (25000 units/215mL sodium chloride 0.45%) (1,050 Units/hr Intravenous New Bag/Given 03/29/19 1310)  nitroGLYCERIN (NITROSTAT) SL tablet 0.4 mg (has no administration in time range)  morphine 2 MG/ML injection 2 mg (has no administration in time range)  sodium chloride flush (NS) 0.9 % injection 3 mL (3 mLs Intravenous Not Given 03/29/19 1311)  acetaminophen (TYLENOL) tablet 650 mg (has no administration in time range)  ondansetron (ZOFRAN) injection 4 mg (has no administration in time range)  hydrALAZINE (APRESOLINE) tablet 25 mg (has no administration in time range)  aspirin chewable tablet 324 mg (324 mg Oral Given 03/29/19 1305)  heparin bolus via infusion 4,000 Units (4,000 Units Intravenous Bolus from Bag 03/29/19 1311)     ED Discharge Orders    None       Note:  This document was prepared using Dragon voice recognition software and may include unintentional dictation errors.   Shaune Pollack, MD 03/29/19 [phone]

## 2019-03-29 NOTE — ED Triage Notes (Signed)
Patient c/o burning central chest discomfort last night. No radiation. Reports he had labs drawn yesterday and was called this morning and told his cardiac markers were elevated

## 2019-03-30 ENCOUNTER — Encounter: Payer: Self-pay | Admitting: Cardiology

## 2019-03-30 DIAGNOSIS — E7849 Other hyperlipidemia: Secondary | ICD-10-CM

## 2019-03-30 LAB — LIPID PANEL
Cholesterol: 184 mg/dL (ref 0–200)
HDL: 34 mg/dL — ABNORMAL LOW (ref >40)
LDL Cholesterol: 129 mg/dL — ABNORMAL HIGH (ref 0–99)
Total CHOL/HDL Ratio: 5.4 RATIO
Triglycerides: 107 mg/dL (ref <150)
VLDL: 21 mg/dL (ref 0–40)

## 2019-03-30 LAB — HEMOGLOBIN A1C
Hgb A1c MFr Bld: 5.6 % (ref 4.8–5.6)
Mean Plasma Glucose: 114.02 mg/dL

## 2019-03-30 LAB — HIV ANTIBODY (ROUTINE TESTING W REFLEX): HIV Screen 4th Generation wRfx: NONREACTIVE

## 2019-03-30 MED ORDER — ATORVASTATIN CALCIUM 40 MG PO TABS: 40.0000 mg | ORAL_TABLET | Freq: Every day | ORAL | 0 refills | Status: DC

## 2019-03-30 NOTE — Progress Notes (Signed)
PROGRESS NOTE    Brady Johnson  LZJ:[id number] DOB: 1960-12-14 DOA: 03/29/2019 PCP: Chiquita Loth, MD      Assessment & Plan:   Principal Problem:   Chest pain Active Problems:   Hypertension   Gout   Hyperlipidemia   Chest pain: diffused deep T wave inversion on EKG, concerning for ischemia. S/p cardiac cath and no stents place. IV heparin drip has been d/c. Morphine, nitro prn. Continue on aspirin. Continue on tele. Troponin neg so far  HTN: continue on losartan. Hydralazine prn  Gout: continue on prn colchicine (not taking colchicine currently)  Hyperlipidemia: continue on statin  GERD: continue on PPI  DVT prophylaxis: SCDs Code Status: full      Consultants:   cardio   Procedures:  Cardiac cath   Antimicrobials: n/a   Subjective: Pt c/o fatigue  Objective: Vitals:   03/30/19 0600 03/30/19 0700 03/30/19 0800 03/30/19 0900  BP: (!) 150/93   (!) 164/98  Pulse: 62 64 67 79  Resp: 10 15 17 16   Temp:    (!) 97 F (36.1 C)  TempSrc:    Axillary  SpO2: 99% 99% 98% 97%  Weight:      Height:        Intake/Output Summary (Last 24 hours) at 03/30/2019 0912 Last data filed at 03/30/2019 0600 Gross per 24 hour  Intake 1212.06 ml  Output 500 ml  Net 712.06 ml   Filed Weights   03/29/19 1120 03/29/19 1453 03/29/19 1841  Weight: 95.3 kg 95.3 kg 89.5 kg    Examination:  General exam: Appears calm and comfortable  Respiratory system: Clear to auscultation. Respiratory effort normal. Cardiovascular system: S1 & S2 normal. No JVD, murmurs, rubs, gallops or clicks. No pedal edema. Gastrointestinal system: Abdomen is nondistended, soft and nontender. No organomegaly or masses felt. Normal bowel sounds heard. Central nervous system: Alert and oriented. Moves all 4 extremities. Psychiatry: Judgement and insight appear normal. Mood & affect appropriate.     Data Reviewed: I have personally reviewed following labs and imaging  studies  CBC: Recent Labs  Lab 03/29/19 1153  WBC 7.2  HGB 15.3  HCT 45.6  MCV 90.3  PLT 024   Basic Metabolic Panel: Recent Labs  Lab 03/29/19 1153  NA 141  K 4.1  CL 109  CO2 24  GLUCOSE 102*  BUN 13  CREATININE 1.12  CALCIUM 9.8   GFR: Estimated Creatinine Clearance: 79 mL/min (by C-G formula based on SCr of 1.12 mg/dL). Liver Function Tests: No results for input(s): AST, ALT, ALKPHOS, BILITOT, PROT, ALBUMIN in the last 168 hours. No results for input(s): LIPASE, AMYLASE in the last 168 hours. No results for input(s): AMMONIA in the last 168 hours. Coagulation Profile: Recent Labs  Lab 03/29/19 1153  INR 1.0   Cardiac Enzymes: No results for input(s): CKTOTAL, CKMB, CKMBINDEX, TROPONINI in the last 168 hours. BNP (last 3 results) No results for input(s): PROBNP in the last 8760 hours. HbA1C: No results for input(s): HGBA1C in the last 72 hours. CBG: Recent Labs  Lab 03/29/19 1854  GLUCAP 109*   Lipid Profile: Recent Labs    03/30/19 0451  CHOL 184  HDL 34*  LDLCALC 129*  TRIG 107  CHOLHDL 5.4   Thyroid Function Tests: No results for input(s): TSH, T4TOTAL, FREET4, T3FREE, THYROIDAB in the last 72 hours. Anemia Panel: No results for input(s): VITAMINB12, FOLATE, FERRITIN, TIBC, IRON, RETICCTPCT in the last 72 hours. Sepsis Labs: No results for input(s): PROCALCITON,  LATICACIDVEN in the last 168 hours.  Recent Results (from the past 240 hour(s))  Respiratory Panel by RT PCR (Flu A&B, Covid) - Nasopharyngeal Swab     Status: None   Collection Time: 03/29/19 12:16 PM   Specimen: Nasopharyngeal Swab  Result Value Ref Range Status   SARS Coronavirus 2 by RT PCR NEGATIVE NEGATIVE Final    Comment: (NOTE) SARS-CoV-2 target nucleic acids are NOT DETECTED. The SARS-CoV-2 RNA is generally detectable in upper respiratoy specimens during the acute phase of infection. The lowest concentration of SARS-CoV-2 viral copies this assay can detect is 131  copies/mL. A negative result does not preclude SARS-Cov-2 infection and should not be used as the sole basis for treatment or other patient management decisions. A negative result may occur with  improper specimen collection/handling, submission of specimen other than nasopharyngeal swab, presence of viral mutation(s) within the areas targeted by this assay, and inadequate number of viral copies (<131 copies/mL). A negative result must be combined with clinical observations, patient history, and epidemiological information. The expected result is Negative. Fact Sheet for Patients:  https://www.moore.com/ Fact Sheet for Healthcare Providers:  https://www.young.biz/ This test is not yet ap proved or cleared by the [place] FDA and  has been authorized for detection and/or diagnosis of SARS-CoV-2 by FDA under an Emergency Use Authorization (EUA). This EUA will remain  in effect (meaning this test can be used) for the duration of the COVID-19 declaration under Section 564(b)(1) of the Act, 21 U.S.C. section 360bbb-3(b)(1), unless the authorization is terminated or revoked sooner.    Influenza A by PCR NEGATIVE NEGATIVE Final   Influenza B by PCR NEGATIVE NEGATIVE Final    Comment: (NOTE) The Xpert Xpress SARS-CoV-2/FLU/RSV assay is intended as an aid in  the diagnosis of influenza from Nasopharyngeal swab specimens and  should not be used as a sole basis for treatment. Nasal washings and  aspirates are unacceptable for Xpert Xpress SARS-CoV-2/FLU/RSV  testing. Fact Sheet for Patients: https://www.moore.com/ Fact Sheet for Healthcare Providers: https://www.young.biz/ This test is not yet approved or cleared by the [place] FDA and  has been authorized for detection and/or diagnosis of SARS-CoV-2 by  FDA under an Emergency Use Authorization (EUA). This EUA will remain  in effect (meaning this test can  be used) for the duration of the  Covid-19 declaration under Section 564(b)(1) of the Act, 21  U.S.C. section 360bbb-3(b)(1), unless the authorization is  terminated or revoked. Performed at [facility], [address] [place], [place] [address]   MRSA PCR Screening     Status: None   Collection Time: 03/29/19  6:52 PM   Specimen: Nasal Mucosa; Nasopharyngeal  Result Value Ref Range Status   MRSA by PCR NEGATIVE NEGATIVE Final    Comment:        The GeneXpert MRSA Assay (FDA approved for NASAL specimens only), is one component of a comprehensive MRSA colonization surveillance program. It is not intended to diagnose MRSA infection nor to guide or monitor treatment for MRSA infections. Performed at [facility], [address] [place], [place] [address]          Radiology Studies: DG Chest 2 View  Result Date: 03/29/2019 CLINICAL DATA:  Chest pain. EXAM: CHEST - 2 VIEW COMPARISON:  None. FINDINGS: The heart size and mediastinal contours are within normal limits. Both lungs are clear. No pneumothorax or pleural effusion is noted. The visualized skeletal structures are unremarkable. IMPRESSION: No active cardiopulmonary disease. Electronically Signed  By: Lupita RaiderJames  Green Jr M.D.   On: 03/29/2019 11:45   CARDIAC CATHETERIZATION  Result Date: 03/29/2019  Normal overall left ventricular function ejection fraction of 60%  Left dominant system  No significant obstructive coronary disease just minor irregularities  Conclusion Benign cardiac cath Normal left ventricular function Ejection fraction of 60% Successful right radial left heart cardiac cath        Scheduled Meds: . aspirin  324 mg Oral Daily  . atorvastatin  40 mg Oral q1800  . losartan  100 mg Oral Daily  . pantoprazole  40 mg Oral Daily  . sodium chloride flush  3 mL Intravenous Q12H  . sodium chloride flush  3 mL Intravenous Q12H   Continuous Infusions: . sodium chloride 75 mL/hr at  03/29/19 1455  . sodium chloride       LOS: 1 day    Time spent: 30 mins    Charise KillianJamiese M Rykker Coviello, MD [facility] Pager [phone]  If 7PM-7AM, please contact night-coverage [email or web address] Password [email or web address] 03/30/2019, 9:12 AM

## 2019-03-30 NOTE — Discharge Summary (Signed)
Physician Discharge Summary  Brady Johnson GHW:[id number] DOB: Jun 30, 1960 DOA: 03/29/2019  PCP: Margorie John, MD  Admit date: 03/29/2019 Discharge date: 03/30/2019  Admitted From: home Disposition:  home  Recommendations for Outpatient Follow-up:  1. Follow up with PCP in 1-2 weeks 2. F/u w/ cardio in 1-2 weeks (Dr, Juliann Pares)   Home Health: n/a Equipment/Devices:n/a  Discharge Condition: stable CODE STATUS: full  Diet recommendation: Heart Healthy   Brief/Interim Summary: HPI taken from Dr. Clyde Lundborg: Brady Johnson is a 58 y.o. male with medical history significant of HTN, HLD, gout, OSA not on CPAP, who presents with chest pain.  Pt states that he had chest pain last night which is located in substernal area, burning-like-like, 2 out of 10 severity, nonradiating. Currently his chest pain has resolved. Denies shortness of breath, cough, fever or chills.  No nausea vomiting, diarrhea, abdominal pain, symptoms of UTI or unilateral weakness. He states that he had labs drawn yesterday and was told to come to the ED for an "abnormal heart lab" int he 500s.  ED Course: pt was found to have trop 13, INR 1.0, PTT 26, WBC 7.2, creatinine 1.12, BUN 13, GFR>60, temperature normal, elevated blood pressure 171/99, heart rate 70s, oxygen saturation 97% on room air.  Chest x-ray negative.  EKG has diffused T wave inversion. Pt is admitted to tele bed as inpt. Card, Dr. Juliann Pares was consulted by EDP.  Hospital Course from Dr. Mayford Knife: Pt presented w/ chest pain and found to have T wave inversions on EKG. Troponins were neg. Pt had a cardiac cath but no blockages were found and NO stents were placed. Cardio recommended increased dose of statin. Pt should f/u w/ cardio in 1-2 weeks (Dr. Juliann Pares).   Discharge Diagnoses:  Principal Problem:   Chest pain Active Problems:   Hypertension   Gout   Hyperlipidemia  Chest pain: diffused deep T wave inversion on EKG,concerning for ischemia. S/p  cardiac cath and no blockages & no stents placed. IV heparin drip has been d/c. Morphine, nitro prn. Continue on aspirin. Continue on tele. Troponin neg so far  HTN: continue on losartan. Hydralazine prn  Gout: continue on prncolchicine (not taking colchicine currently)  Hyperlipidemia: continue on statin  GERD: continue on PPI   Discharge Instructions  Discharge Instructions    Diet - low sodium heart healthy   Complete by: As directed    Discharge instructions   Complete by: As directed    F/u w/ Dr. Juliann Pares (cardio) in 1-2 weeks; F/u PCP in 1-2 weeks   Increase activity slowly   Complete by: As directed      Allergies as of 03/30/2019   No Known Allergies     Medication List    STOP taking these medications   simvastatin 20 MG tablet Commonly known as: ZOCOR     TAKE these medications   atorvastatin 40 MG tablet Commonly known as: LIPITOR Take 1 tablet (40 mg total) by mouth daily at 6 PM.   colchicine 0.6 MG tablet Take 1 tablet by mouth daily as needed.   losartan 100 MG tablet Commonly known as: COZAAR Take 100 mg by mouth daily.       No Known Allergies  Consultations:  cardio   Procedures/Studies: DG Chest 2 View  Result Date: 03/29/2019 CLINICAL DATA:  Chest pain. EXAM: CHEST - 2 VIEW COMPARISON:  None. FINDINGS: The heart size and mediastinal contours are within normal limits. Both lungs are clear. No pneumothorax or pleural effusion  is noted. The visualized skeletal structures are unremarkable. IMPRESSION: No active cardiopulmonary disease. Electronically Signed   By: Lupita RaiderJames  Green Jr M.D.   On: 03/29/2019 11:45   CARDIAC CATHETERIZATION  Result Date: 03/29/2019  Normal overall left ventricular function ejection fraction of 60%  Left dominant system  No significant obstructive coronary disease just minor irregularities  Conclusion Benign cardiac cath Normal left ventricular function Ejection fraction of 60% Successful right radial  left heart cardiac cath     Subjective: Pt c/o fatigue  Discharge Exam: Vitals:   03/30/19 1400 03/30/19 1700  BP: (!) 145/96 (!) 148/98  Pulse: 85   Resp: (!) 21   Temp:    SpO2: 98%    Vitals:   03/30/19 1300 03/30/19 1330 03/30/19 1400 03/30/19 1700  BP: (!) 157/103 (!) 165/96 (!) 145/96 (!) 148/98  Pulse: 67 68 85   Resp: 12 13 (!) 21   Temp:      TempSrc:      SpO2: 98% 97% 98%   Weight:      Height:        General: Pt is alert, awake, not in acute distress Cardiovascular:, S1/S2 +, no rubs, no gallops Respiratory: CTA bilaterally, no wheezing, no rhonchi Abdominal: Soft, NT, ND, bowel sounds + Extremities: no edema, no cyanosis    The results of significant diagnostics from this hospitalization (including imaging, microbiology, ancillary and laboratory) are listed below for reference.     Microbiology: Recent Results (from the past 240 hour(s))  Respiratory Panel by RT PCR (Flu A&B, Covid) - Nasopharyngeal Swab     Status: None   Collection Time: 03/29/19 12:16 PM   Specimen: Nasopharyngeal Swab  Result Value Ref Range Status   SARS Coronavirus 2 by RT PCR NEGATIVE NEGATIVE Final    Comment: (NOTE) SARS-CoV-2 target nucleic acids are NOT DETECTED. The SARS-CoV-2 RNA is generally detectable in upper respiratoy specimens during the acute phase of infection. The lowest concentration of SARS-CoV-2 viral copies this assay can detect is 131 copies/mL. A negative result does not preclude SARS-Cov-2 infection and should not be used as the sole basis for treatment or other patient management decisions. A negative result may occur with  improper specimen collection/handling, submission of specimen other than nasopharyngeal swab, presence of viral mutation(s) within the areas targeted by this assay, and inadequate number of viral copies (<131 copies/mL). A negative result must be combined with clinical observations, patient history, and epidemiological  information. The expected result is Negative. Fact Sheet for Patients:  https://www.moore.com/https://www.fda.gov/media/142436/download Fact Sheet for Healthcare Providers:  https://www.young.biz/https://www.fda.gov/media/142435/download This test is not yet ap proved or cleared by the [place] FDA and  has been authorized for detection and/or diagnosis of SARS-CoV-2 by FDA under an Emergency Use Authorization (EUA). This EUA will remain  in effect (meaning this test can be used) for the duration of the COVID-19 declaration under Section 564(b)(1) of the Act, 21 U.S.C. section 360bbb-3(b)(1), unless the authorization is terminated or revoked sooner.    Influenza A by PCR NEGATIVE NEGATIVE Final   Influenza B by PCR NEGATIVE NEGATIVE Final    Comment: (NOTE) The Xpert Xpress SARS-CoV-2/FLU/RSV assay is intended as an aid in  the diagnosis of influenza from Nasopharyngeal swab specimens and  should not be used as a sole basis for treatment. Nasal washings and  aspirates are unacceptable for Xpert Xpress SARS-CoV-2/FLU/RSV  testing. Fact Sheet for Patients: https://www.moore.com/https://www.fda.gov/media/142436/download Fact Sheet for Healthcare Providers: https://www.young.biz/https://www.fda.gov/media/142435/download This test is not yet approved or cleared  by the [place] and  has been authorized for detection and/or diagnosis of SARS-CoV-2 by  FDA under an Emergency Use Authorization (EUA). This EUA will remain  in effect (meaning this test can be used) for the duration of the  Covid-19 declaration under Section 564(b)(1) of the Act, 21  U.S.C. section 360bbb-3(b)(1), unless the authorization is  terminated or revoked. Performed at [facility], [address] [place], [place] [address]   MRSA PCR Screening     Status: None   Collection Time: 03/29/19  6:52 PM   Specimen: Nasal Mucosa; Nasopharyngeal  Result Value Ref Range Status   MRSA by PCR NEGATIVE NEGATIVE Final    Comment:        The GeneXpert MRSA Assay (FDA approved for NASAL  specimens only), is one component of a comprehensive MRSA colonization surveillance program. It is not intended to diagnose MRSA infection nor to guide or monitor treatment for MRSA infections. Performed at [facility], [address] [place], [place] [address]      Labs: BNP (last 3 results) No results for input(s): BNP in the last 8760 hours. Basic Metabolic Panel: Recent Labs  Lab 03/29/19 1153  NA 141  K 4.1  CL 109  CO2 24  GLUCOSE 102*  BUN 13  CREATININE 1.12  CALCIUM 9.8   Liver Function Tests: No results for input(s): AST, ALT, ALKPHOS, BILITOT, PROT, ALBUMIN in the last 168 hours. No results for input(s): LIPASE, AMYLASE in the last 168 hours. No results for input(s): AMMONIA in the last 168 hours. CBC: Recent Labs  Lab 03/29/19 1153  WBC 7.2  HGB 15.3  HCT 45.6  MCV 90.3  PLT 231   Cardiac Enzymes: No results for input(s): CKTOTAL, CKMB, CKMBINDEX, TROPONINI in the last 168 hours. BNP: Invalid input(s): POCBNP CBG: Recent Labs  Lab 03/29/19 1854  GLUCAP 109*   D-Dimer No results for input(s): DDIMER in the last 72 hours. Hgb A1c Recent Labs    03/30/19 0451  HGBA1C 5.6   Lipid Profile Recent Labs    03/30/19 0451  CHOL 184  HDL 34*  LDLCALC 129*  TRIG 107  CHOLHDL 5.4   Thyroid function studies No results for input(s): TSH, T4TOTAL, T3FREE, THYROIDAB in the last 72 hours.  Invalid input(s): FREET3 Anemia work up No results for input(s): VITAMINB12, FOLATE, FERRITIN, TIBC, IRON, RETICCTPCT in the last 72 hours. Urinalysis    Component Value Date/Time   COLORURINE Yellow 06/25/2012 1655   APPEARANCEUR Hazy 06/25/2012 1655   LABSPEC 1.027 06/25/2012 1655   PHURINE 5.0 06/25/2012 1655   GLUCOSEU Negative 06/25/2012 1655   HGBUR 1+ 06/25/2012 1655   BILIRUBINUR Negative 06/25/2012 1655   KETONESUR Negative 06/25/2012 1655   PROTEINUR 30 mg/dL 09/81/1914 7829   NITRITE Negative 06/25/2012 1655   LEUKOCYTESUR  Negative 06/25/2012 1655   Sepsis Labs Invalid input(s): PROCALCITONIN,  WBC,  LACTICIDVEN Microbiology Recent Results (from the past 240 hour(s))  Respiratory Panel by RT PCR (Flu A&B, Covid) - Nasopharyngeal Swab     Status: None   Collection Time: 03/29/19 12:16 PM   Specimen: Nasopharyngeal Swab  Result Value Ref Range Status   SARS Coronavirus 2 by RT PCR NEGATIVE NEGATIVE Final    Comment: (NOTE) SARS-CoV-2 target nucleic acids are NOT DETECTED. The SARS-CoV-2 RNA is generally detectable in upper respiratoy specimens during the acute phase of infection. The lowest concentration of SARS-CoV-2 viral copies this assay can detect is 131 copies/mL. A negative  result does not preclude SARS-Cov-2 infection and should not be used as the sole basis for treatment or other patient management decisions. A negative result may occur with  improper specimen collection/handling, submission of specimen other than nasopharyngeal swab, presence of viral mutation(s) within the areas targeted by this assay, and inadequate number of viral copies (<131 copies/mL). A negative result must be combined with clinical observations, patient history, and epidemiological information. The expected result is Negative. Fact Sheet for Patients:  PinkCheek.be Fact Sheet for Healthcare Providers:  GravelBags.it This test is not yet ap proved or cleared by the [place] FDA and  has been authorized for detection and/or diagnosis of SARS-CoV-2 by FDA under an Emergency Use Authorization (EUA). This EUA will remain  in effect (meaning this test can be used) for the duration of the COVID-19 declaration under Section 564(b)(1) of the Act, 21 U.S.C. section 360bbb-3(b)(1), unless the authorization is terminated or revoked sooner.    Influenza A by PCR NEGATIVE NEGATIVE Final   Influenza B by PCR NEGATIVE NEGATIVE Final    Comment: (NOTE) The Xpert Xpress  SARS-CoV-2/FLU/RSV assay is intended as an aid in  the diagnosis of influenza from Nasopharyngeal swab specimens and  should not be used as a sole basis for treatment. Nasal washings and  aspirates are unacceptable for Xpert Xpress SARS-CoV-2/FLU/RSV  testing. Fact Sheet for Patients: PinkCheek.be Fact Sheet for Healthcare Providers: GravelBags.it This test is not yet approved or cleared by the [place] FDA and  has been authorized for detection and/or diagnosis of SARS-CoV-2 by  FDA under an Emergency Use Authorization (EUA). This EUA will remain  in effect (meaning this test can be used) for the duration of the  Covid-19 declaration under Section 564(b)(1) of the Act, 21  U.S.C. section 360bbb-3(b)(1), unless the authorization is  terminated or revoked. Performed at [facility], [address] [place], [place] [address]   MRSA PCR Screening     Status: None   Collection Time: 03/29/19  6:52 PM   Specimen: Nasal Mucosa; Nasopharyngeal  Result Value Ref Range Status   MRSA by PCR NEGATIVE NEGATIVE Final    Comment:        The GeneXpert MRSA Assay (FDA approved for NASAL specimens only), is one component of a comprehensive MRSA colonization surveillance program. It is not intended to diagnose MRSA infection nor to guide or monitor treatment for MRSA infections. Performed at [facility], [address], [place], [place] [address]      Time coordinating discharge: Over 30 minutes  SIGNED:   Wyvonnia Dusky, MD  [facility] 03/30/2019, 7:00 PM Pager   If 7PM-7AM, please contact night-coverage [email or web address] Password [email or web address]

## 2019-04-12 ENCOUNTER — Encounter: Payer: Self-pay | Admitting: Internal Medicine

## 2019-04-12 ENCOUNTER — Ambulatory Visit (INDEPENDENT_AMBULATORY_CARE_PROVIDER_SITE_OTHER): Payer: BC Managed Care – PPO | Admitting: Internal Medicine

## 2019-04-12 ENCOUNTER — Other Ambulatory Visit: Payer: Self-pay

## 2019-04-12 VITALS — BP 128/78 | Ht 68.0 in | Wt 204.2 lb

## 2019-04-12 DIAGNOSIS — R9431 Abnormal electrocardiogram [ECG] [EKG]: Secondary | ICD-10-CM | POA: Diagnosis not present

## 2019-04-12 DIAGNOSIS — I10 Essential (primary) hypertension: Secondary | ICD-10-CM | POA: Diagnosis not present

## 2019-04-12 DIAGNOSIS — R778 Other specified abnormalities of plasma proteins: Secondary | ICD-10-CM

## 2019-04-12 DIAGNOSIS — R011 Cardiac murmur, unspecified: Secondary | ICD-10-CM

## 2019-04-12 NOTE — Progress Notes (Addendum)
New Outpatient Visit Date: 04/12/2019  Referring Provider: Margorie John, MD [address] [place],  [place] [address]  Chief Complaint: Follow-up cardiac catheterization and hypertension  HPI:  Brady Johnson is a 58 y.o. male who is being seen today for the evaluation of chest pain at the request of Margorie John, MD. He has a history of hypertension, hyperlipidemia, obstructive sleep apnea, and gout.  He was hospitalized earlier this month due to an abnormal blood test.  He had blood drawn on 12/1 as part of a life insurance application.  He was contacted a few weeks later and was told that he should seek immediate medical attention because a blood test for his heart was elevated.  In the ED, his HS-TnI was negligible and flat.  However, EKG showed widespread ST/T changes, prompting cardiac catheterization.  This did not show any significant coronary artery disease, and the patient was discharged on escalated blood pressure regimen.  Today, Brady Johnson reports feeling well.  He denies having had any chest pain, palpitations, lightheadedness, syncope, orthopnea, PND, and edema.  He has mild exertional dyspnea with strenuous activities, which has been stable for years.  He notes that several family members on his father's side of the family have had heart attacks at a young age.  He denies a family history of heart failure and sudden cardiac death.  Since being started on the new BP medications after hospital discharge, he has noticed a gradual downward trend in his blood pressure (127/80 at home last night).  --------------------------------------------------------------------------------------------------  Cardiovascular History & Procedures: Cardiovascular Problems:  Elevated troponin and abnormal EKG  Risk Factors:  Hypertension, hyperlipidemia, obesity, male gender, family history and age > 42  Cath/PCI:  LHC (03/29/2019): No significant coronary artery disease.  LVEF 60%.  CV  Surgery:  None  EP Procedures and Devices:  None  Non-Invasive Evaluation(s):  None  Recent CV Pertinent Labs: Lab Results  Component Value Date   CHOL 184 03/30/2019   HDL 34 (L) 03/30/2019   LDLCALC 129 (H) 03/30/2019   TRIG 107 03/30/2019   CHOLHDL 5.4 03/30/2019   INR 1.0 03/29/2019   K 4.1 03/29/2019   K 3.6 06/25/2012   BUN 13 03/29/2019   BUN 17 06/25/2012   CREATININE 1.12 03/29/2019   CREATININE 1.33 (H) 06/25/2012    --------------------------------------------------------------------------------------------------  Past Medical History:  Diagnosis Date  . Erectile dysfunction   . Gout   . Hyperandrogenism   . Hyperlipidemia   . Hypertension   . Sleep apnea     Past Surgical History:  Procedure Laterality Date  . CHOANAL ADENIODECTOMY    . COLONOSCOPY WITH PROPOFOL N/A 05/13/2015   Procedure: COLONOSCOPY WITH PROPOFOL;  Surgeon: Christena Deem, MD;  Location: [facility] ENDOSCOPY;  Service: Endoscopy;  Laterality: N/A;  . LEFT HEART CATH AND CORONARY ANGIOGRAPHY N/A 03/29/2019   Procedure: LEFT HEART CATH AND CORONARY ANGIOGRAPHY and possible PCI and stent;  Surgeon: Alwyn Pea, MD;  Location: [facility] INVASIVE CV LAB;  Service: Cardiovascular;  Laterality: N/A;  . TONSILLECTOMY    . TONSILLECTOMY AND ADENOIDECTOMY      Current Meds  Medication Sig  . colchicine 0.6 MG tablet Take 1 tablet by mouth daily as needed.   Marland Kitchen losartan (COZAAR) 100 MG tablet Take 100 mg by mouth daily.  . [DISCONTINUED] amLODipine (NORVASC) 5 MG tablet Take by mouth.  . [DISCONTINUED] atorvastatin (LIPITOR) 40 MG tablet Take 1 tablet (40 mg total) by mouth daily  at 6 PM.  . [DISCONTINUED] hydrochlorothiazide (HYDRODIURIL) 12.5 MG tablet Take by mouth.    Allergies: Patient has no known allergies.  Social History   Tobacco Use  . Smoking status: Never Smoker  . Smokeless tobacco: Never Used  Substance Use Topics  . Alcohol use: No  . Drug use: No    Family  History  Problem Relation Age of Onset  . Heart attack Father 36  . Heart attack Nephew 33  . Heart failure Neg Hx   . Sudden Cardiac Death Neg Hx     Review of Systems: A 12-system review of systems was performed and was negative except as noted in the HPI.  --------------------------------------------------------------------------------------------------  Physical Exam: BP 128/78   Ht 5\' 8"  (1.727 m)   Wt 204 lb 4 oz (92.6 kg)   SpO2 99%   BMI 31.06 kg/m   General:  NAD. HEENT: No conjunctival pallor or scleral icterus. Facemask in place. Neck: Supple without lymphadenopathy, thyromegaly, JVD, or HJR. No carotid bruit. Lungs: Normal work of breathing. Clear to auscultation bilaterally without wheezes or crackles. Heart: Regular rate and rhythm with 2/6 systolic murmur.  No rubs or gallops. Abd: Bowel sounds present. Soft, NT/ND without hepatosplenomegaly Ext: No lower extremity edema. Radial, PT, and DP pulses are 2+ bilaterally Skin: Warm and dry without rash. Neuro: CNIII-XII intact. Strength and fine-touch sensation intact in upper and lower extremities bilaterally. Psych: Normal mood and affect.  EKG:  NSR with borderline LVH and anterolateral ST depression and T wave inversions.  Lab Results  Component Value Date   WBC 7.2 03/29/2019   HGB 15.3 03/29/2019   HCT 45.6 03/29/2019   MCV 90.3 03/29/2019   PLT 231 03/29/2019    Lab Results  Component Value Date   NA 141 03/29/2019   K 4.1 03/29/2019   CL 109 03/29/2019   CO2 24 03/29/2019   BUN 13 03/29/2019   CREATININE 1.12 03/29/2019   GLUCOSE 102 (H) 03/29/2019   ALT 23 06/25/2012    Lab Results  Component Value Date   CHOL 184 03/30/2019   HDL 34 (L) 03/30/2019   LDLCALC 129 (H) 03/30/2019   TRIG 107 03/30/2019   CHOLHDL 5.4 03/30/2019     --------------------------------------------------------------------------------------------------  ASSESSMENT AND PLAN: Elevated troponin, abnormal EKG,  and heart murmur Brady Johnson does not report symptoms of CAD.  Additionally, recent catheterization, which I have personally reviewed, shows no evidence of CAD.  However, the ventriculogram and EKG the possibility of hypertrophic cardiomyopathy (particularly apical variant).  Of note, history of hypertensive heart disease could also be contributing.  I have recommended that we obtain a transthoracic echocardiogram.  Based on results, we may need to consider cardiac MRI if the echo substantiates potential for HCM.  Hypertension: BP adequately controlled today.  Pending aforementioned workup, I will defer medication changes.  If HCM is confirmed, I would favor modifying regimen to include a beta-blocker and/or non-dihydropyridine calcium channel blocker.  Follow-up: Return to clinic in ~1 month.  Nelva Bush, MD 05/12/2019 4:15 PM

## 2019-04-12 NOTE — Patient Instructions (Signed)
Medication Instructions:  Your physician recommends that you continue on your current medications as directed. Please refer to the Current Medication list given to you today.  *If you need a refill on your cardiac medications before your next appointment, please call your pharmacy*  Lab Work: none If you have labs (blood work) drawn today and your tests are completely normal, you will receive your results only by: . MyChart Message (if you have MyChart) OR . A paper copy in the mail If you have any lab test that is abnormal or we need to change your treatment, we will call you to review the results.  Testing/Procedures: Your physician has requested that you have an echocardiogram. Echocardiography is a painless test that uses sound waves to create images of your heart. It provides your doctor with information about the size and shape of your heart and how well your heart's chambers and valves are working. This procedure takes approximately one hour. There are no restrictions for this procedure. You may get an IV, if needed, to receive an ultrasound enhancing agent through to better visualize your heart.   Follow-Up: At [facility], you and your health needs are our priority.  As part of our continuing mission to provide you with exceptional heart care, we have created designated Provider Care Teams.  These Care Teams include your primary Cardiologist (physician) and Advanced Practice Providers (APPs -  Physician Assistants and Nurse Practitioners) who all work together to provide you with the care you need, when you need it.  Your next appointment:   1 month(s)  The format for your next appointment:   In Person  Provider:    You may see DR CHRISTOPHER END or one of the following Advanced Practice Providers on your designated Care Team:    Christopher Berge, NP  Ryan Dunn, PA-C  Jacquelyn Visser, PA-C   Echocardiogram An echocardiogram is a procedure that uses painless sound  waves (ultrasound) to produce an image of the heart. Images from an echocardiogram can provide important information about:  Signs of coronary artery disease (CAD).  Aneurysm detection. An aneurysm is a weak or damaged part of an artery wall that bulges out from the normal force of blood pumping through the body.  Heart size and shape. Changes in the size or shape of the heart can be associated with certain conditions, including heart failure, aneurysm, and CAD.  Heart muscle function.  Heart valve function.  Signs of a past heart attack.  Fluid buildup around the heart.  Thickening of the heart muscle.  A tumor or infectious growth around the heart valves. Tell a health care provider about:  Any allergies you have.  All medicines you are taking, including vitamins, herbs, eye drops, creams, and over-the-counter medicines.  Any blood disorders you have.  Any surgeries you have had.  Any medical conditions you have.  Whether you are pregnant or may be pregnant. What are the risks? Generally, this is a safe procedure. However, problems may occur, including:  Allergic reaction to dye (contrast) that may be used during the procedure. What happens before the procedure? No specific preparation is needed. You may eat and drink normally. What happens during the procedure?   An IV tube may be inserted into one of your veins.  You may receive contrast through this tube. A contrast is an injection that improves the quality of the pictures from your heart.  A gel will be applied to your chest.  A wand-like tool (transducer)   will be moved over your chest. The gel will help to transmit the sound waves from the transducer.  The sound waves will harmlessly bounce off of your heart to allow the heart images to be captured in real-time motion. The images will be recorded on a computer. The procedure may vary among health care providers and hospitals. What happens after the procedure?   You may return to your normal, everyday life, including diet, activities, and medicines, unless your health care provider tells you not to do that. Summary  An echocardiogram is a procedure that uses painless sound waves (ultrasound) to produce an image of the heart.  Images from an echocardiogram can provide important information about the size and shape of your heart, heart muscle function, heart valve function, and fluid buildup around your heart.  You do not need to do anything to prepare before this procedure. You may eat and drink normally.  After the echocardiogram is completed, you may return to your normal, everyday life, unless your health care provider tells you not to do that. This information is not intended to replace advice given to you by your health care provider. Make sure you discuss any questions you have with your health care provider. Document Released: 03/27/2000 Document Revised: 07/21/2018 Document Reviewed: 05/02/2016 Elsevier Patient Education  2020 Elsevier Inc[employer]   

## 2019-04-14 DIAGNOSIS — I219 Acute myocardial infarction, unspecified: Secondary | ICD-10-CM

## 2019-04-14 HISTORY — DX: Acute myocardial infarction, unspecified: I21.9

## 2019-04-26 ENCOUNTER — Telehealth: Payer: Self-pay | Admitting: Internal Medicine

## 2019-04-26 ENCOUNTER — Other Ambulatory Visit: Payer: Self-pay

## 2019-04-26 NOTE — Telephone Encounter (Signed)
*  STAT* If patient is at the pharmacy, call can be transferred to refill team.   1. Which medications need to be refilled? (please list name of each medication and dose if known) amlodipine 5 mg, atorvastatin 40 mg, hydrochlorothiazide 12.5 mg  2. Which pharmacy/location (including street and city if local pharmacy) is medication to be sent to? [facility] in [place]  3. Do they need a 30 day or 90 day supply? 90  Patient stated that pharmacy did not have any refills on medications and was in last week

## 2019-04-27 ENCOUNTER — Other Ambulatory Visit: Payer: Self-pay

## 2019-04-27 MED ORDER — HYDROCHLOROTHIAZIDE 12.5 MG PO TABS: 12.5000 mg | ORAL_TABLET | Freq: Every day | ORAL | 0 refills | Status: DC

## 2019-04-27 MED ORDER — ATORVASTATIN CALCIUM 40 MG PO TABS: 40.0000 mg | ORAL_TABLET | Freq: Every day | ORAL | 0 refills | Status: DC

## 2019-04-27 MED ORDER — AMLODIPINE BESYLATE 5 MG PO TABS: 5.0000 mg | ORAL_TABLET | Freq: Every day | ORAL | 0 refills | Status: DC

## 2019-04-27 NOTE — Telephone Encounter (Signed)
Requested Prescriptions   Signed Prescriptions Disp Refills  . amLODipine (NORVASC) 5 MG tablet 90 tablet 0    Sig: Take 1 tablet (5 mg total) by mouth daily.    Authorizing Provider: END, CHRISTOPHER    Ordering User: Vishaal Strollo N  . atorvastatin (LIPITOR) 40 MG tablet 90 tablet 0    Sig: Take 1 tablet (40 mg total) by mouth daily at 6 PM.    Authorizing Provider: END, CHRISTOPHER    Ordering User: Raidyn Wassink N  . hydrochlorothiazide (HYDRODIURIL) 12.5 MG tablet 90 tablet 0    Sig: Take 1 tablet (12.5 mg total) by mouth daily.    Authorizing Provider: END, CHRISTOPHER    Ordering User: Isobelle Tuckett N    

## 2019-04-27 NOTE — Telephone Encounter (Signed)
Requested Prescriptions   Signed Prescriptions Disp Refills  . amLODipine (NORVASC) 5 MG tablet 90 tablet 0    Sig: Take 1 tablet (5 mg total) by mouth daily.    Authorizing Provider: END, CHRISTOPHER    Ordering User: Margrett Rud atorvastatin (LIPITOR) 40 MG tablet 90 tablet 0    Sig: Take 1 tablet (40 mg total) by mouth daily at 6 PM.    Authorizing Provider: END, CHRISTOPHER    Ordering User: Margrett Rud hydrochlorothiazide (HYDRODIURIL) 12.5 MG tablet 90 tablet 0    Sig: Take 1 tablet (12.5 mg total) by mouth daily.    Authorizing Provider: END, CHRISTOPHER    Ordering User: Margrett Rud

## 2019-05-12 ENCOUNTER — Other Ambulatory Visit: Payer: Self-pay

## 2019-05-12 ENCOUNTER — Ambulatory Visit (INDEPENDENT_AMBULATORY_CARE_PROVIDER_SITE_OTHER): Payer: BC Managed Care – PPO

## 2019-05-12 DIAGNOSIS — R9431 Abnormal electrocardiogram [ECG] [EKG]: Secondary | ICD-10-CM | POA: Diagnosis not present

## 2019-05-12 DIAGNOSIS — R011 Cardiac murmur, unspecified: Secondary | ICD-10-CM | POA: Diagnosis not present

## 2019-05-12 DIAGNOSIS — I422 Other hypertrophic cardiomyopathy: Secondary | ICD-10-CM

## 2019-05-12 HISTORY — DX: Other hypertrophic cardiomyopathy: I42.2

## 2019-05-17 ENCOUNTER — Other Ambulatory Visit
Admission: RE | Admit: 2019-05-17 | Discharge: 2019-05-17 | Disposition: A | Discharge status: Home / Self Care | Payer: BC Managed Care – PPO | Source: Ambulatory Visit | Attending: Internal Medicine | Admitting: Internal Medicine

## 2019-05-17 ENCOUNTER — Ambulatory Visit (INDEPENDENT_AMBULATORY_CARE_PROVIDER_SITE_OTHER): Payer: BC Managed Care – PPO | Admitting: Internal Medicine

## 2019-05-17 ENCOUNTER — Other Ambulatory Visit: Payer: Self-pay

## 2019-05-17 ENCOUNTER — Encounter: Payer: Self-pay | Admitting: Internal Medicine

## 2019-05-17 VITALS — BP 132/68 | HR 79 | Ht 68.0 in | Wt 208.0 lb

## 2019-05-17 DIAGNOSIS — I422 Other hypertrophic cardiomyopathy: Secondary | ICD-10-CM | POA: Diagnosis not present

## 2019-05-17 DIAGNOSIS — R002 Palpitations: Secondary | ICD-10-CM | POA: Diagnosis present

## 2019-05-17 DIAGNOSIS — Z01810 Encounter for preprocedural cardiovascular examination: Secondary | ICD-10-CM | POA: Diagnosis not present

## 2019-05-17 DIAGNOSIS — I10 Essential (primary) hypertension: Secondary | ICD-10-CM | POA: Diagnosis not present

## 2019-05-17 LAB — BASIC METABOLIC PANEL
Anion gap: 10 (ref 5–15)
BUN: 16 mg/dL (ref 6–20)
CO2: 25 mmol/L (ref 22–32)
Calcium: 9.6 mg/dL (ref 8.9–10.3)
Chloride: 108 mmol/L (ref 98–111)
Creatinine, Ser: 1.33 mg/dL — ABNORMAL HIGH (ref 0.61–1.24)
GFR calc Af Amer: >60 mL/min (ref >60)
GFR calc non Af Amer: 59 mL/min — ABNORMAL LOW (ref >60)
Glucose, Bld: 95 mg/dL (ref 70–99)
Potassium: 4.1 mmol/L (ref 3.5–5.1)
Sodium: 143 mmol/L (ref 135–145)

## 2019-05-17 MED ORDER — METOPROLOL SUCCINATE ER 25 MG PO TB24: 25.0000 mg | ORAL_TABLET | Freq: Every day | ORAL | 6 refills | Status: DC

## 2019-05-17 NOTE — Patient Instructions (Signed)
Medication Instructions:  Your physician has recommended you make the following change in your medication:  1- STOP Hydrochlorothiazide. 2- START Metoprolol succinate 25 mg (1 tablet) by mouth once a day.  *If you need a refill on your cardiac medications before your next appointment, please call your pharmacy*  Lab Work: Your physician recommends that you return for lab work in: sometime prior to MRI.  BMET - [facility] to the [facility]. You will check in at the front desk to the right as you walk into the atrium. Valet Parking is offered if needed. - No appointment needed. You may go any day between 7 am and 6 pm.  If you have labs (blood work) drawn today and your tests are completely normal, you will receive your results only by: Marland Kitchen MyChart Message (if you have MyChart) OR . A paper copy in the mail If you have any lab test that is abnormal or we need to change your treatment, we will call you to review the results.  Testing/Procedures: 1- ZIO monitor - To be placed after Cardiac MRI. A Zio Patch Event Heart monitor will be applied to your chest today.  You will wear the patch for 14 days. After 24 hours, you may shower with the heart monitor on. If you feel any palpitations, you may press and release the button in the middle of the monitor.    2- Cardiac MRI - Your physician has requested that you have a cardiac MRI. Cardiac MRI uses a computer to create images of your heart as its beating, producing both still and moving pictures of your heart and major blood vessels. For further information please visit [email or web address]. Please follow the instruction sheet given to you today for more information.  You are scheduled for Cardiac MRI on __________________.  Please arrive at the [facility] main entrance of [facility] at ______________________* (30-45 minutes prior to test start time). ?  [facility]  [address]  [place], [place] [address]  [phone]  Proceed to the [facility] Radiology Department (First Floor).  ?  Magnetic resonance imaging (MRI) is a painless test that produces images of the inside of the body without using X-rays. During an MRI, strong magnets and radio waves work together in a Data processing manager to form detailed images. MRI images may provide more details about a medical condition than X-rays, CT scans, and ultrasounds can provide.  You may be given earphones to listen for instructions.  You may eat a light breakfast and take medications as ordered.  If a contrast material will be used, an IV will be inserted into one of your veins. Contrast material will be injected into your IV.  You will be asked to remove all metal, including: Watch, jewelry, and other metal objects including hearing aids, hair pieces and dentures. (Braces and fillings normally are not a problem.)  If contrast material was used:  It will leave your body through your urine within a day. You may be told to drink plenty of fluids to help flush the contrast material out of your system.  TEST WILL TAKE APPROXIMATELY 1 HOUR  PLEASE NOTIFY SCHEDULING AT LEAST 24 HOURS IN ADVANCE IF YOU ARE UNABLE TO KEEP YOUR APPOINTMENT.     Follow-Up: At [facility], you and your health needs are our priority.  As part of our continuing mission to provide you with exceptional heart care, we have created designated Provider Care Teams.  These Care  Teams include your primary Cardiologist (physician) and Advanced Practice Providers (APPs -  Physician Assistants and Nurse Practitioners) who all work together to provide you with the care you need, when you need it.  Your next appointment:   2 month(s)  The format for your next appointment:   In Person  Provider:    You may see DR Harrell Gave END or one of the following Advanced Practice Providers on your designated Care Team:    Murray Hodgkins, NP  Christell Faith, PA-C  Marrianne Mood, PA-C    Magnetic  Resonance Imaging Magnetic resonance imaging (MRI) is a painless test that takes pictures of the inside of your body. This test uses a strong magnet. This test does not use X-rays. What happens before the procedure?  You will be asked about any metal you may have in your body. This includes: ? Any joint replacement (prosthesis), such as an artificial knee or hip. ? Any implanted devices or ports. ? A metallic ear implant (cochlear implant). ? An artificial heart valve. ? A metallic object in the eye. ? Metal splinters. ? Bullet fragments. ? Any tattoos. Some of the darker inks can cause problems with testing.  You will be asked to take off all metal. This includes: ? Your watch, jewelry, and other metal objects. ? Hearing aids. ? Dentures. ? Underwire bra. ? Makeup. Some kinds of makeup contain small amounts of metal. ? Braces and fillings normally are not a problem.  If you are breastfeeding, ask your doctor if you need to pump before your test and then stop breastfeeding for a short time. You may need to do this if dye (contrast material) will be used during your MRI.  If you have a fear of cramped spaces (claustrophobia), you may be given medicines prior to the MRI. What happens during the procedure?   You may be given earplugs or headphones to listen to music. The MRI machine can be noisy.  You will lie down on a platform. It looks like a long table.  If dye will be used, an IV tube will be placed into one of your veins. Dye will be given through your IV tube at a certain time as images are taken.  The platform will slide into a tunnel. The tunnel has magnets inside of it. When you are inside the tunnel, you will still be able to talk to your doctor.  The tunnel will scan your body and make images. You will be asked to lie very still. Your doctor will tell you when you can move. You may have to wait a few minutes to make sure that the images from the test are clear.  When  all images are taken, the platform will slide out of the tunnel. The procedure may vary among doctors and hospitals. What happens after the procedure?  You may be taken to a recovery area if sedation medicines were used. Your blood pressure, heart rate, breathing rate, and blood oxygen level will be monitored until you leave the hospital or clinic.  If dye was used: ? It will leave your body through your pee (urine). It takes about a day for all of the dye to leave the body. ? You may be told to drink plenty of fluids. This helps your body get rid of the dye. ? If you are breastfeeding, do not breastfeed your child until your doctor says that this is safe.  You may go back to your normal activities right away, or  as told by your doctor.  It is up to you to get your test results. Ask your doctor, or the department that is doing the test, when your results will be ready. Summary  Magnetic resonance imaging (MRI) is a test that takes pictures of the inside of your body.  Before your MRI, be sure to tell your doctor about any metal you may have in your body.  You may go back to your normal activities right away, or as told by your doctor. This information is not intended to replace advice given to you by your health care provider. Make sure you discuss any questions you have with your health care provider. Document Revised: 02/22/2017 Document Reviewed: 02/22/2017 Elsevier Patient Education  2020 [employer].

## 2019-05-17 NOTE — Progress Notes (Signed)
Follow-up Outpatient Visit Date: 05/17/2019  Primary Care Provider: [facility] [place] [id number]  Chief Complaint: Follow-up abnormal EKG and echo  HPI:  Brady Johnson is a 59 y.o. 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  I met Brady Johnson in late December following hospitalization for elevated troponin noted on life insurance application.  He presented to the ED and was noted to have widespread ST/T changes prompting cardiac catheterization.  No significant CAD was identified, though left ventriculogram was suspicious for hypertrophic cardiomyopathy.  At that time of our visit, Brady Johnson was feeling well other than mild chronic exertional dyspnea that had been present for years.  Today, Brady Johnson reports feeling well, denying chest pain, shortness of breath, lightheadedness, and edema.  He reports a few episodes of palpitations that seem to happen after taking amlodipine.  There are no associated symptoms.  His blood pressure at home is typically around 140/80.  --------------------------------------------------------------------------------------------------  Cardiovascular History & Procedures: Cardiovascular Problems:  Elevated troponin and abnormal EKG (suspect hypertrophic cardiomyopathy)  Risk Factors:  Hypertension, hyperlipidemia, obesity, male gender, family history and age > 55  Cath/PCI:  LHC (03/29/2019): No significant coronary artery disease.  LVEF 60%.  CV Surgery:  None  EP Procedures and Devices:  None  Non-Invasive Evaluation(s):  TTE (05/11/19): Normal LV size with moderate to severe, asymmetric left ventricular hypertrophy.  LVEF 60-65% with grade 1 diastolic dysfunction.  Mild resting LVOT gradient noted (~15 mmHg).  Normal RV size and function.  Moderate left atrial  enlargement.  Mild aortic root dilation (4.0 cm).  Recent CV Pertinent Labs: Lab Results  Component Value Date   CHOL 184 03/30/2019   HDL 34 (L) 03/30/2019   LDLCALC 129 (H) 03/30/2019   TRIG 107 03/30/2019   CHOLHDL 5.4 03/30/2019   INR 1.0 03/29/2019   K 4.1 05/17/2019   K 3.6 06/25/2012   BUN 16 05/17/2019   BUN 17 06/25/2012   CREATININE 1.33 (H) 05/17/2019   CREATININE 1.33 (H) 06/25/2012    Past medical and surgical history were reviewed and updated in EPIC.  Current Meds  Medication Sig  . amLODipine (NORVASC) 5 MG tablet Take 1 tablet (5 mg total) by mouth daily.  Marland Kitchen atorvastatin (LIPITOR) 40 MG tablet Take 1 tablet (40 mg total) by mouth daily at 6 PM.  . colchicine 0.6 MG tablet Take 1 tablet by mouth daily as needed.   Marland Kitchen losartan (COZAAR) 100 MG tablet Take 100 mg by mouth daily.  . [DISCONTINUED] hydrochlorothiazide (HYDRODIURIL) 12.5 MG tablet Take 1 tablet (12.5 mg total) by mouth daily.    Allergies: Patient has no known allergies.  Social History   Tobacco Use  . Smoking status: Never Smoker  . Smokeless tobacco: Never Used  Substance Use Topics  . Alcohol use: No  . Drug use: No    Family History  Problem Relation Age of Onset  . Heart attack Father 71  . Heart attack Nephew 33  . Heart failure Neg Hx   . Sudden Cardiac Death Neg Hx     Review of Systems: A 12-system review of systems was performed and was negative except as noted in the HPI.  --------------------------------------------------------------------------------------------------  Physical Exam: BP 132/68 (BP Location: Left Arm, Patient Position: Sitting, Cuff Size: Normal)   Pulse 79   Ht 5' 8"  (1.727 m)  Wt 208 lb (94.3 kg)   SpO2 98%   BMI 31.63 kg/m   General:  NAD. HEENT: No conjunctival pallor or scleral icterus. Facemask in place. Neck: Supple without lymphadenopathy, thyromegaly, JVD, or HJR. Lungs: Normal work of breathing. Clear to auscultation bilaterally  without wheezes or crackles. Heart: Regular rate and rhythm with 2/6 systolic murmur loudest at the LLSB.  No rubs or gallops. Abd: Bowel sounds present. Soft, NT/ND without hepatosplenomegaly Ext: No lower extremity edema.  EKG:  NSR with possible LAE, LVH, and widespread ST depressions and T-wave inversions.  No significant change from prior tracing on 04/12/2019.  Lab Results  Component Value Date   WBC 7.2 03/29/2019   HGB 15.3 03/29/2019   HCT 45.6 03/29/2019   MCV 90.3 03/29/2019   PLT 231 03/29/2019    Lab Results  Component Value Date   NA 143 05/17/2019   K 4.1 05/17/2019   CL 108 05/17/2019   CO2 25 05/17/2019   BUN 16 05/17/2019   CREATININE 1.33 (H) 05/17/2019   GLUCOSE 95 05/17/2019   ALT 23 06/25/2012    Lab Results  Component Value Date   CHOL 184 03/30/2019   HDL 34 (L) 03/30/2019   LDLCALC 129 (H) 03/30/2019   TRIG 107 03/30/2019   CHOLHDL 5.4 03/30/2019    --------------------------------------------------------------------------------------------------  ASSESSMENT AND PLAN: Hypertrophic cardiomyopathy: EKG, cath, and echo findings are all strongly suggestive of hypertrophic cardiomyopathy.  Brady Johnson is asymptomatic other than brief palpitations without associated symptoms.  I will refer him for cardiac MRI to better characterize his LV, including potential scar.  We will also arrange for 14 day event monitor to exclude NSVT/VT.  We will stop HCTZ and start metoprolol succinate 25 mg daily.  I advised Brady Johnson to avoid strenuous activities/exercise.  If MRI confirms HCM, we will refer him for genetics evaluation +/- EP consultation.  Palpitations: Brief and without associated symptoms but somewhat worrisome in the setting of suspected HCM diagnosis.  We will start metoprolol succinate 25 mg daily and obtain a 14 day event monitor.  Hypertension: BP upper normal today but seems to be running higher at home.  I will stop HCTZ to reduce risk of  dehydration in the setting of HCM and add metoprolol succinate 25 mg daily.  Follow-up: Return to clinic in 2 months.  Nelva Bush, MD 05/19/2019 8:19 AM

## 2019-05-18 ENCOUNTER — Telehealth: Payer: Self-pay | Admitting: Internal Medicine

## 2019-05-18 NOTE — Telephone Encounter (Signed)
Attempted to schedule 2 m fu per checkout 05/17/19 .  LMOV to call office.

## 2019-05-19 DIAGNOSIS — I422 Other hypertrophic cardiomyopathy: Secondary | ICD-10-CM | POA: Insufficient documentation

## 2019-05-19 DIAGNOSIS — R002 Palpitations: Secondary | ICD-10-CM | POA: Insufficient documentation

## 2019-05-25 ENCOUNTER — Encounter: Payer: Self-pay | Admitting: *Deleted

## 2019-05-25 ENCOUNTER — Telehealth: Payer: Self-pay | Admitting: *Deleted

## 2019-05-25 NOTE — Telephone Encounter (Signed)
Left message regarding appointment for Cardiac MRI scheduled 06/20/19 at 12:00pm---arrival time is 11:15 am 1st floor admissions office at [facility]---will mail information to patient and it is available in My Chart

## 2019-06-13 DIAGNOSIS — I422 Other hypertrophic cardiomyopathy: Secondary | ICD-10-CM

## 2019-06-13 HISTORY — DX: Other hypertrophic cardiomyopathy: I42.2

## 2019-06-17 ENCOUNTER — Other Ambulatory Visit: Payer: Self-pay

## 2019-06-19 ENCOUNTER — Telehealth (HOSPITAL_COMMUNITY): Payer: Self-pay | Admitting: Emergency Medicine

## 2019-06-19 ENCOUNTER — Encounter (HOSPITAL_COMMUNITY): Payer: Self-pay

## 2019-06-19 MED ORDER — AMLODIPINE BESYLATE 5 MG PO TABS: 5.0000 mg | ORAL_TABLET | Freq: Every day | ORAL | 2 refills | Status: DC

## 2019-06-19 NOTE — Telephone Encounter (Signed)
Reaching out to patient to offer assistance regarding upcoming cardiac imaging study; pt verbalizes understanding of appt date/time, parking situation and where to check in, and verified current allergies; name and call back number provided for further questions should they arise Zariyah Stephens RN Navigator Cardiac Imaging [facility] [phone] office [phone] cell   Pt denies implants, denies claustro, denies diabetes supplies 

## 2019-06-20 ENCOUNTER — Ambulatory Visit (HOSPITAL_COMMUNITY)
Admission: RE | Admit: 2019-06-20 | Discharge: 2019-06-20 | Disposition: A | Discharge status: Home / Self Care | Payer: BC Managed Care – PPO | Source: Ambulatory Visit | Attending: Internal Medicine | Admitting: Internal Medicine

## 2019-06-20 ENCOUNTER — Other Ambulatory Visit: Payer: Self-pay

## 2019-06-20 ENCOUNTER — Ambulatory Visit (INDEPENDENT_AMBULATORY_CARE_PROVIDER_SITE_OTHER): Payer: BC Managed Care – PPO

## 2019-06-20 DIAGNOSIS — R002 Palpitations: Secondary | ICD-10-CM

## 2019-06-20 DIAGNOSIS — I422 Other hypertrophic cardiomyopathy: Secondary | ICD-10-CM

## 2019-06-20 MED ORDER — GADOBUTROL 1 MMOL/ML IV SOLN
12.0000 mL | Freq: Once | INTRAVENOUS | Status: AC | PRN
  Administered 2019-06-20: 12 mL via INTRAVENOUS

## 2019-06-22 ENCOUNTER — Telehealth: Payer: Self-pay

## 2019-06-22 DIAGNOSIS — I422 Other hypertrophic cardiomyopathy: Secondary | ICD-10-CM

## 2019-06-22 NOTE — Telephone Encounter (Signed)
Tried to call patient-no answer and no voicemail.  Referral placed to genetics with Dr. Jomarie Longs and EP.

## 2019-06-22 NOTE — Telephone Encounter (Signed)
-----   Message from Yvonne Kendall, MD sent at 06/22/2019  6:38 AM EST ----- Please let Brady Johnson know that the cardiac MRI confirms hypertrophic cardiomyopathy.  Please refer him to the cardiac genetics clinic and electrophysiology (EP visit should be after event monitor has been completed).  In the meantime, he should continue his current medications.

## 2019-06-23 NOTE — Telephone Encounter (Signed)
Results called to pt. Pt verbalized understanding of results and plan of care. Appointments for referrals have already been scheduled.

## 2019-07-18 DIAGNOSIS — I4729 Other ventricular tachycardia: Secondary | ICD-10-CM

## 2019-07-18 DIAGNOSIS — I4719 Other supraventricular tachycardia: Secondary | ICD-10-CM

## 2019-07-18 HISTORY — DX: Other ventricular tachycardia: I47.29

## 2019-07-18 HISTORY — DX: Other supraventricular tachycardia: I47.19

## 2019-07-20 ENCOUNTER — Ambulatory Visit (INDEPENDENT_AMBULATORY_CARE_PROVIDER_SITE_OTHER): Payer: BC Managed Care – PPO | Admitting: Internal Medicine

## 2019-07-20 ENCOUNTER — Other Ambulatory Visit: Payer: Self-pay

## 2019-07-20 ENCOUNTER — Encounter: Payer: Self-pay | Admitting: Internal Medicine

## 2019-07-20 VITALS — BP 150/90 | HR 62 | Ht 68.0 in | Wt 209.1 lb

## 2019-07-20 DIAGNOSIS — I472 Ventricular tachycardia: Secondary | ICD-10-CM

## 2019-07-20 DIAGNOSIS — I10 Essential (primary) hypertension: Secondary | ICD-10-CM | POA: Diagnosis not present

## 2019-07-20 DIAGNOSIS — I422 Other hypertrophic cardiomyopathy: Secondary | ICD-10-CM | POA: Diagnosis not present

## 2019-07-20 DIAGNOSIS — E7800 Pure hypercholesterolemia, unspecified: Secondary | ICD-10-CM

## 2019-07-20 DIAGNOSIS — I471 Supraventricular tachycardia: Secondary | ICD-10-CM

## 2019-07-20 DIAGNOSIS — I4729 Other ventricular tachycardia: Secondary | ICD-10-CM

## 2019-07-20 NOTE — Progress Notes (Signed)
Follow-up Outpatient Visit Date: 07/20/2019  Primary Care Provider: [facility] [id number]  Chief Complaint: Follow-up hypertrophic cardiomyopathy  HPI:  Brady Johnson is a 59 y.o. male with history of hypertrophic cardiomyopathy, hypertension, hyperlipidemia, obstructive sleep apnea, and gout, who presents for follow-up of hypertrophic cardiomyopathy, I last saw him in February, at which time he was doing well other than chronic exertional dyspnea.  Brady Johnson feels well today, denying chest pain, shortness of breath, palpitations, lightheadedness, and edema.  He has not had syncope or near-syncope.  Home BP is usually around 130-140/80-85 mmHg.  He is tolerating his current medications well.  He wonders if a particularly salty meal earlier today may be contributing to his elevated blood pressure this afternoon.  --------------------------------------------------------------------------------------------------  Cardiovascular History & Procedures: Cardiovascular Problems:  Elevated troponin and abnormal EKG (suspect hypertrophic cardiomyopathy)  Risk Factors:  Hypertension, hyperlipidemia, obesity, male gender,family historyand age >40  Cath/PCI:  LHC (03/29/2019): No significant coronary artery disease. LVEF 60%.  CV Surgery:  None  EP Procedures and Devices:  None  Non-Invasive Evaluation(s):  TTE (05/11/19): Normal LV size with moderate to severe, asymmetric left ventricular hypertrophy.  LVEF 60-65% with grade 1 diastolic dysfunction.  Mild resting LVOT gradient noted (~15 mmHg).  Normal RV size and function.  Moderate left atrial enlargement.  Mild aortic root dilation (4.0 cm).  Recent CV Pertinent Labs: Lab Results  Component Value Date   CHOL 184 03/30/2019   HDL 34 (L) 03/30/2019   LDLCALC 129 (H) 03/30/2019   TRIG 107 03/30/2019   CHOLHDL 5.4 03/30/2019   INR 1.0 03/29/2019   K 4.1 05/17/2019   K 3.6  06/25/2012   BUN 16 05/17/2019   BUN 17 06/25/2012   CREATININE 1.33 (H) 05/17/2019   CREATININE 1.33 (H) 06/25/2012    Past medical and surgical history were reviewed and updated in EPIC.  Current Meds  Medication Sig  . amLODipine (NORVASC) 5 MG tablet Take 1 tablet (5 mg total) by mouth daily.  Marland Kitchen atorvastatin (LIPITOR) 40 MG tablet Take 1 tablet (40 mg total) by mouth daily at 6 PM.  . colchicine 0.6 MG tablet Take 1 tablet by mouth daily as needed.   Marland Kitchen losartan (COZAAR) 100 MG tablet Take 100 mg by mouth daily.  . metoprolol succinate (TOPROL-XL) 25 MG 24 hr tablet Take 1 tablet (25 mg total) by mouth daily.    Allergies: Patient has no known allergies.  Social History   Tobacco Use  . Smoking status: Never Smoker  . Smokeless tobacco: Never Used  Substance Use Topics  . Alcohol use: No  . Drug use: No    Family History  Problem Relation Age of Onset  . Heart attack Father 14  . Heart attack Nephew 33  . Heart failure Neg Hx   . Sudden Cardiac Death Neg Hx     Review of Systems: A 12-system review of systems was performed and was negative except as noted in the HPI.  --------------------------------------------------------------------------------------------------  Physical Exam: BP (!) 150/90 (BP Location: Left Arm, Patient Position: Sitting, Cuff Size: Normal)   Pulse 62   Ht 5\' 8"  (1.727 m)   Wt 209 lb 2 oz (94.9 kg)   SpO2 98%   BMI 31.80 kg/m   General:  NAD. HEENT: No conjunctival pallor or scleral icterus. Facemask in place. Neck: Supple without lymphadenopathy, thyromegaly, JVD, or HJR. Lungs: Normal work of breathing. Clear to auscultation bilaterally without  wheezes or crackles. Heart: Regular rate and rhythm without murmurs, rubs, or gallops. Non-displaced PMI. Abd: Bowel sounds present. Soft, NT/ND without hepatosplenomegaly Ext: No lower extremity edema.  EKG:  Normal sinus rhythm with left atrial enlargement and LVH with abnormal  repolarization.  Lab Results  Component Value Date   WBC 7.2 03/29/2019   HGB 15.3 03/29/2019   HCT 45.6 03/29/2019   MCV 90.3 03/29/2019   PLT 231 03/29/2019    Lab Results  Component Value Date   NA 143 05/17/2019   K 4.1 05/17/2019   CL 108 05/17/2019   CO2 25 05/17/2019   BUN 16 05/17/2019   CREATININE 1.33 (H) 05/17/2019   GLUCOSE 95 05/17/2019   ALT 23 06/25/2012    Lab Results  Component Value Date   CHOL 184 03/30/2019   HDL 34 (L) 03/30/2019   LDLCALC 129 (H) 03/30/2019   TRIG 107 03/30/2019   CHOLHDL 5.4 03/30/2019    --------------------------------------------------------------------------------------------------  ASSESSMENT AND PLAN: Hypertrophic cardiomyopathy: Brady Johnson remains asymptomatic.  Recent event monitor showed a single 7-beat run of NSVT.  Brady Johnson is currently scheduled to see Dr. Graciela Johnson in 10/2019; we will see if this can be moved up at all.  He is also scheduled to see Dr. Jomarie Johnson in the genetics clinic next month.  He has spoken with his children and siblings about his recent diagnosis of HCM so that they can undergo appropriate screening.  As heart rate is 62 bpm today, we will defer changes to metoprolol at this time.  I asked Brady Johnson to avoid vigorous exercise.  PSVT and NSVT: Asymptomatic, though NSVT is concerning in the setting of HCM as well as patching late gadolinium enhancement on cardiac MRI.  We will continue current dose of metoprolol and work to expedite EP consultation to discuss role for ICD placement.  Hypertension: Blood pressure mildly elevated today but typically better at home.  I encouraged Brady Johnson to limit his sodium intake.  We will defer medication changes today.  Hyperlipidemia: Mildly elevated LDL noted at the time his his hospitalization in 03/2019.  Given lack of ASCVD, there is no strong indication for continued high-intensity statin therapy.  We have agreed to recheck a lipid panel at Brady Johnson'  convenience with the hope of deescalating or event stopping atorvastatin in the future.  Follow-up: Return to clinic in 6 months.  Yvonne Kendall, MD 07/20/2019 3:53 PM

## 2019-07-20 NOTE — Patient Instructions (Signed)
Medication Instructions:  Your physician recommends that you continue on your current medications as directed. Please refer to the Current Medication list given to you today.  *If you need a refill on your cardiac medications before your next appointment, please call your pharmacy*   Lab Work: Your physician recommends that you return for lab work in: at your earliest convenience to check your cholesterol level.  - Please go to the [facility]. You will check in at the front desk to the right as you walk into the atrium. Valet Parking is offered if needed. - No appointment needed. You may go any day between 7 am and 6 pm. - You will need to be fasting. Please do not have anything to eat or drink after midnight the morning you have the lab work. You may only have water or black coffee with no cream or sugar.  If you have labs (blood work) drawn today and your tests are completely normal, you will receive your results only by: Marland Kitchen MyChart Message (if you have MyChart) OR . A paper copy in the mail If you have any lab test that is abnormal or we need to change your treatment, we will call you to review the results.   Testing/Procedures: none   Follow-Up: You have been referred to Electrophysiology. I will send a message to the EP scheduler to see if there is a sooner appointment time in [place].  At [facility], you and your health needs are our priority.  As part of our continuing mission to provide you with exceptional heart care, we have created designated Provider Care Teams.  These Care Teams include your primary Cardiologist (physician) and Advanced Practice Providers (APPs -  Physician Assistants and Nurse Practitioners) who all work together to provide you with the care you need, when you need it.  We recommend signing up for the patient portal called "MyChart".  Sign up information is provided on this After Visit Summary.  MyChart is used to connect with patients for  Virtual Visits (Telemedicine).  Patients are able to view lab/test results, encounter notes, upcoming appointments, etc.  Non-urgent messages can be sent to your provider as well.   To learn more about what you can do with MyChart, go to [email or web address].    Your next appointment:   6 month(s)  The format for your next appointment:   In Person  Provider:    You may see DR Cristal Deer END or one of the following Advanced Practice Providers on your designated Care Team:    Nicolasa Ducking, NP  Eula Listen, PA-C  Marisue Ivan, PA-C

## 2019-07-21 ENCOUNTER — Encounter: Payer: Self-pay | Admitting: Internal Medicine

## 2019-07-21 DIAGNOSIS — I472 Ventricular tachycardia: Secondary | ICD-10-CM | POA: Insufficient documentation

## 2019-07-21 DIAGNOSIS — E7800 Pure hypercholesterolemia, unspecified: Secondary | ICD-10-CM | POA: Insufficient documentation

## 2019-07-21 DIAGNOSIS — I471 Supraventricular tachycardia: Secondary | ICD-10-CM | POA: Insufficient documentation

## 2019-07-21 DIAGNOSIS — I4729 Other ventricular tachycardia: Secondary | ICD-10-CM | POA: Insufficient documentation

## 2019-07-25 ENCOUNTER — Other Ambulatory Visit
Admission: RE | Admit: 2019-07-25 | Discharge: 2019-07-25 | Disposition: A | Discharge status: Home / Self Care | Payer: BC Managed Care – PPO | Attending: Internal Medicine | Admitting: Internal Medicine

## 2019-07-25 ENCOUNTER — Other Ambulatory Visit: Payer: Self-pay

## 2019-07-25 DIAGNOSIS — I422 Other hypertrophic cardiomyopathy: Secondary | ICD-10-CM | POA: Insufficient documentation

## 2019-07-25 DIAGNOSIS — E7800 Pure hypercholesterolemia, unspecified: Secondary | ICD-10-CM | POA: Insufficient documentation

## 2019-07-25 LAB — LIPID PANEL
Cholesterol: 131 mg/dL (ref 0–200)
HDL: 38 mg/dL — ABNORMAL LOW (ref >40)
LDL Cholesterol: 71 mg/dL (ref 0–99)
Total CHOL/HDL Ratio: 3.4 RATIO
Triglycerides: 111 mg/dL (ref <150)
VLDL: 22 mg/dL (ref 0–40)

## 2019-07-25 LAB — ALT: ALT: 15 U/L (ref 0–44)

## 2019-07-26 ENCOUNTER — Telehealth: Payer: Self-pay | Admitting: *Deleted

## 2019-07-26 MED ORDER — ATORVASTATIN CALCIUM 20 MG PO TABS: 20.0000 mg | ORAL_TABLET | Freq: Every day | ORAL | 2 refills | Status: DC

## 2019-07-26 NOTE — Telephone Encounter (Signed)
-----   Message from Yvonne Kendall, MD sent at 07/25/2019  4:28 PM EDT ----- Please let Mr. Mciver know that his cholesterol is well controlled.  Given minimal coronary artery disease at the time of his catheterization in December, I think it would be reasonable to decrease atorvastatin to 20 mg daily.

## 2019-07-26 NOTE — Telephone Encounter (Signed)
Results called to pt. Pt verbalized understanding of results and plan of care. Rx sent to pharmacy.

## 2019-08-22 ENCOUNTER — Encounter: Payer: Self-pay | Admitting: Internal Medicine

## 2019-08-22 ENCOUNTER — Other Ambulatory Visit: Payer: Self-pay

## 2019-08-22 ENCOUNTER — Ambulatory Visit: Payer: BC Managed Care – PPO | Admitting: Internal Medicine

## 2019-08-22 VITALS — BP 138/86 | HR 70 | Ht 68.0 in | Wt 211.8 lb

## 2019-08-22 DIAGNOSIS — I422 Other hypertrophic cardiomyopathy: Secondary | ICD-10-CM

## 2019-08-22 NOTE — Patient Instructions (Signed)
Medication Instructions:  Your physician recommends that you continue on your current medications as directed. Please refer to the Current Medication list given to you today.  *If you need a refill on your cardiac medications before your next appointment, please call your pharmacy*   Lab Work: None ordered.  If you have labs (blood work) drawn today and your tests are completely normal, you will receive your results only by: Marland Kitchen MyChart Message (if you have MyChart) OR . A paper copy in the mail If you have any lab test that is abnormal or we need to change your treatment, we will call you to review the results.   Testing/Procedures: Your physician has requested that you have an echocardiogram. Echocardiography is a painless test that uses sound waves to create images of your heart. It provides your doctor with information about the size and shape of your heart and how well your heart's chambers and valves are working. This procedure takes approximately one hour. There are no restrictions for this procedure.  Follow-Up: At [facility], you and your health needs are our priority.  As part of our continuing mission to provide you with exceptional heart care, we have created designated Provider Care Teams.  These Care Teams include your primary Cardiologist (physician) and Advanced Practice Providers (APPs -  Physician Assistants and Nurse Practitioners) who all work together to provide you with the care you need, when you need it.  We recommend signing up for the patient portal called "MyChart".  Sign up information is provided on this After Visit Summary.  MyChart is used to connect with patients for Virtual Visits (Telemedicine).  Patients are able to view lab/test results, encounter notes, upcoming appointments, etc.  Non-urgent messages can be sent to your provider as well.   To learn more about what you can do with MyChart, go to [email or web address].    Your next appointment:   To be  determined after echo  The format for your next appointment:   In person  Provider:   Dr Graciela Husbands

## 2019-08-22 NOTE — Progress Notes (Signed)
ELECTROPHYSIOLOGY CONSULT NOTE  Patient ID: LEVII HAIRFIELD, MRN: [id number], DOB/AGE: 58-Sep-1962 58 y.o. Admit date: (Not on file) Date of Consult: 08/22/2019  Primary Physician: [facility] Primary Cardiologist: CE     Brady Johnson is a 59 y.o. male who is being seen today for the evaluation of HCM at the request of CE.    HPI Brady Johnson is a 60 y.o. male referred because of a diagnosis of hypertrophic obstructive cardiomyopathy.  This came to attention 12/20 when he had blood work done as part of an Licensed conveyancer.  He was referred to the emergency room because of abnormal results; do not know what it was.  High sensitive troponin in the ER was normal.  However, ECG demonstrated diffuse ST-T changes consistent with HCM.  He underwent catheterization demonstrated no obstructive disease.  He has modest exercise intolerance.  He engages his activity levels to prevent dyspnea.  He says he is able to climb stairs.  Has a history of hypertension which has been poorly treated and that since therapy has been initiated he has felt better.  Again  DATE TEST EF   12/20 LHC  60 % Nonobstructive CAD  1/21 Echo  65% Asymmetric hypertrophy 20/15; LAE mod Grad-resting 6mm   *3/21 cMRI    % Asymmetric septal hypertrophy 20/10; some apical involvement LGE + 4%         Date Cr K Hgb  2/21 1.33 4.1 15.3         Holter Personally reviewed   One run VT-NS 146 bpm 7 beats HCM risk no syncope no Fam Hx    Past Medical History:  Diagnosis Date  . Erectile dysfunction   . Gout   . Hyperandrogenism   . Hyperlipidemia   . Hypertension   . Sleep apnea       Surgical History:  Past Surgical History:  Procedure Laterality Date  . CHOANAL ADENIODECTOMY    . COLONOSCOPY WITH PROPOFOL N/A 05/13/2015   Procedure: COLONOSCOPY WITH PROPOFOL;  Surgeon: Lollie Sails, MD;  Location: [facility] ENDOSCOPY;  Service: Endoscopy;  Laterality: N/A;  .  LEFT HEART CATH AND CORONARY ANGIOGRAPHY N/A 03/29/2019   Procedure: LEFT HEART CATH AND CORONARY ANGIOGRAPHY and possible PCI and stent;  Surgeon: Yolonda Kida, MD;  Location: [place] CV LAB;  Service: Cardiovascular;  Laterality: N/A;  . TONSILLECTOMY    . TONSILLECTOMY AND ADENOIDECTOMY       Home Meds: Current Meds  Medication Sig  . amLODipine (NORVASC) 5 MG tablet Take 1 tablet (5 mg total) by mouth daily.  Marland Kitchen aspirin EC 81 MG tablet Take 81 mg by mouth daily.  Marland Kitchen atorvastatin (LIPITOR) 20 MG tablet Take 1 tablet (20 mg total) by mouth daily.  . colchicine 0.6 MG tablet Take 1 tablet by mouth daily as needed.   Marland Kitchen losartan (COZAAR) 100 MG tablet Take 100 mg by mouth daily.  . metoprolol succinate (TOPROL-XL) 25 MG 24 hr tablet Take 1 tablet (25 mg total) by mouth daily.  . sildenafil (REVATIO) 20 MG tablet Take 20 mg by mouth as needed.    Allergies: No Known Allergies  Social History   Socioeconomic History  . Marital status: Married    Spouse name: Not on file  . Number of children: Not on file  . Years of education: Not on file  . Highest education level: Not on file  Occupational History  . Not  on file  Tobacco Use  . Smoking status: Never Smoker  . Smokeless tobacco: Never Used  Substance and Sexual Activity  . Alcohol use: No  . Drug use: No  . Sexual activity: Not on file  Other Topics Concern  . Not on file  Social History Narrative  . Not on file   Social Determinants of Health   Financial Resource Strain:   . Difficulty of Paying Living Expenses:   Food Insecurity:   . Worried About Programme researcher, broadcasting/film/video in the Last Year:   . Barista in the Last Year:   Transportation Needs:   . Freight forwarder (Medical):   Marland Kitchen Lack of Transportation (Non-Medical):   Physical Activity:   . Days of Exercise per Week:   . Minutes of Exercise per Session:   Stress:   . Feeling of Stress :   Social Connections:   . Frequency of Communication  with Friends and Family:   . Frequency of Social Gatherings with Friends and Family:   . Attends Religious Services:   . Active Member of Clubs or Organizations:   . Attends Banker Meetings:   Marland Kitchen Marital Status:   Intimate Partner Violence:   . Fear of Current or Ex-Partner:   . Emotionally Abused:   Marland Kitchen Physically Abused:   . Sexually Abused:      Family History  Problem Relation Age of Onset  . Heart attack Father 57  . Heart attack Nephew 33  . Heart failure Neg Hx   . Sudden Cardiac Death Neg Hx      ROS:  Please see the history of present illness.     All other systems reviewed and negative.    Physical Exam: Blood pressure 138/86, pulse 70, height 5\' 8"  (1.727 m), weight 211 lb 12.8 oz (96.1 kg), SpO2 96 %. General: Well developed, well nourished male in no acute distress. Head: Normocephalic, atraumatic, sclera non-icteric, no xanthomas, nares are without discharge. EENT: normal  Lymph Nodes:  none Neck: Negative for carotid bruits. JVD not elevated. Back:without scoliosis kyphosis Lungs: Clear bilaterally to auscultation without wheezes, rales, or rhonchi. Breathing is unlabored. Heart: RRR with S1 S2.  2/6 systolic murmur enhanced with valsalva -- heard best at the apex  No rubs, or gallops appreciated. Abdomen: Soft, non-tender, non-distended with normoactive bowel sounds. No hepatomegaly. No rebound/guarding. No obvious abdominal masses. Msk:  Strength and tone appear normal for age. Extremities: No clubbing or cyanosis. No edema.  Distal pedal pulses are 2+ and equal bilaterally. Skin: Warm and Dry Neuro: Alert and oriented X 3. CN III-XII intact Grossly normal sensory and motor function . Psych:  Responds to questions appropriately with a normal affect.      Labs: Cardiac Enzymes No results for input(s): CKTOTAL, CKMB, TROPONINI in the last 72 hours. CBC Lab Results  Component Value Date   WBC 7.2 03/29/2019   HGB 15.3 03/29/2019   HCT 45.6  03/29/2019   MCV 90.3 03/29/2019   PLT 231 03/29/2019   PROTIME: No results for input(s): LABPROT, INR in the last 72 hours. Chemistry No results for input(s): NA, K, CL, CO2, BUN, CREATININE, CALCIUM, PROT, BILITOT, ALKPHOS, ALT, AST, GLUCOSE in the last 168 hours.  Invalid input(s): LABALBU Lipids Lab Results  Component Value Date   CHOL 131 07/25/2019   HDL 38 (L) 07/25/2019   LDLCALC 71 07/25/2019   TRIG 111 07/25/2019   BNP No results found for: PROBNP Thyroid  Function Tests: No results for input(s): TSH, T4TOTAL, T3FREE, THYROIDAB in the last 72 hours.  Invalid input(s): FREET3 Miscellaneous No results found for: DDIMER  Radiology/Studies:  No results found.  EKG: Sinus with diffuse T wave inversions in the inferolateral leads   Assessment and Plan:  Hypertrophic obstructive cardiomyopathy  Hypertension  VT-nonsustained  The patient has hypertrophic obstructive cardiomyopathy.  The ECG is more suggestive of apical disease.  Indeed the MRI report describes apical disease in addition to outflow obstruction.  He has symptoms of exercise intolerance; I suspect that this is at least in part related to his HCM.  With outflow obstruction, vasodilators are typically not utilized; hence, we will repeat his echo with a Valsalva to confirm the presence of outflow obstruction and then we will need to change his antihypertensives, either increase his beta-blocker and/or stopping his amlodipine and possibly his losartan and using losartan.  As it relates to his risk of sudden death, he has no major risk factors.  His nonsustained ventricular tachycardia is short and slow.  His LGE percentage is considerably less than 10%.  Hence, there is no indication for an ICD.  He does have genetic counseling scheduled.  I have reiterated the importance of this as relates to risk identification of his family   Sherryl Manges

## 2019-08-22 NOTE — Progress Notes (Signed)
e

## 2019-08-24 ENCOUNTER — Ambulatory Visit: Payer: BC Managed Care – PPO | Admitting: Genetic Counselor

## 2019-08-24 ENCOUNTER — Other Ambulatory Visit: Payer: Self-pay

## 2019-09-03 NOTE — Progress Notes (Signed)
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 He tells me that an abnormal blood work when he applied for his life insurance in December 2020 prompted him to see his PCP. Upon further testing, he reports have no blockages by CT scan but an abnormal Echocardiogram and EKG. Cardiac MRI of 06/20/19 indicated asymmetric septal hypertrophy with basal septum of 2 cm. He does report having dyspnea upon exertion, some fatigue, episodic dizziness for the last 6-8 months. Denies syncope or chest pains. He walks a mile everyday.   Traditional Risk Factors Brady Johnson was diagnosed with hypertension about 15-20 years ago and states that it is well controlled by medication.   Family history Brady Johnson (III.1) has two daughters, ages 6 and 57 (IV.1, IV.2) -both have not undergone screening for HCM. His younger brother (III.2), age 73 has heart disease; he does not know the exact nature of his heart condition and tells me that his brother is scheduled for an EKG and echocardiogram. He reports no overt heart issues in his daughters or his brother's children (IV.3, IV.4).  Brady Johnson's father (II.12) has two heart attacks at age 28 and 2 and later died of kidney failure at 87. Five of his siblings (II.1-II.3, II.5) died of heart attack; the others died of kidney failure (II.4) or natural causes (II.6-II.8, II.10-II.11). Paternal grandfather (I.1) died of heart attack at 79 and paternal grandmother (I.2) of kidney failure.   Brady Johnson's mother (II.13) was diagnosed with breast cancer and died at 51 from natural causes. Her siblings (II.14-II.23), except for a brother (II.24),  died of natural causes or cancer. Her youngest brother (II.24) was found dead on his chair with a gun in his hand. He was 78.  Genetics Brady Johnson was counseled on the genetics of hypertrophic cardiomyopathy (HCM). I explained to him that this is an autosomal dominant condition and hence his daughters have a 50% chance of inheriting HCM. I explained to him that his first degree-relatives, namely children and siblings seek regular surveillance for HCM.  Clinical screening involves echocardiogram and EKG at regular intervals, frequency is typically determined by age, with those over the age of 70 getting screened every 5 years. He verbalized understanding of this.   I discussed penetrance of HCM being incomplete i.e. not all individuals harboring a HCM mutation will present clinically with HCM, and age-related penetrance where clinical presentation of HCM increases with advanced age. Based on his family history it is not clear if he inherited this condition as there are no first-degree relatives with HCM or sudden death. It is likely that he is de novo for HCM in his family.  I also reviewed variable expression of HCM and emphasized that this condition can express at any age at level of severity in the family. Hence, it is important for his children to stay vigilant and seek regular surveillance for HCM. He verbalized understanding of this. I informed him that some patients - about 8-10% can have compound and digenic mutations for HCM. Also briefly discussed the inheritance pattern and treatment /management plans for the infiltrative cardiomyopathies that present as HCM phenocopies.   We walked through the process of genetic testing. I explained to him that  genetic testing is a probabilistic test dependent upon her age and severity of presentation, presence of risk factors for HCM and importantly family history of HCM or sudden death in first-degree relatives. He verbalized understanding of this.  The potential  outcomes of genetic testing and subsequent management of at-risk family members were discussed so as to manage expectations-  I explained to him that if a mutation is not identified, then all first-degree relatives should regular screening for HCM. I emphasized that even if the genetic test is negative, it does not mean that he does not have HCM. A negative test result can be due to limitations of the genetic test. He verbalized understanding of this.  There is also the likelihood of identifying a "Variant of unknown significance". This result means that the variant has not been detected in a statistically significant number of HCM patients and/or functional studies have not been performed to verify its pathogenicity. This VUS can be tested in the family to see if it segregates with disease. If a VUS is found, first-degree relatives should undergo regular clinical screening for HCM.  If a pathogenic variant is reported, then his first-degree family members - daughters and brother can get tested for this variant. If they test positive, it is likely they will develop HCM. In light of variable expression and incomplete penetrance associated with HCM, it is not possible to predict when they will manifest clinically with HCM. It is recommended that family members that test positive for the familial pathogenic variant pursue clinical screening for HCM. Family members that test negative for the familial mutation need not pursue periodic screening for HCM, but seek care if the symptoms develop.   Impression  Brady Johnson is symptomatic and diagnosed with HCM at age 32. There is no significant family history of HCM or sudden death amongst his first-degree relatives. His age of presentation and severity of presentation is indicative of a genetic condition. Genetic testing for HCM is recommended. This test should include the major genes for HCM as well as the HCM phenocopies of Fabry disease, Danon disease, Wolf-Parkinson  White syndrome and FTA. Genetic testing will confirm if he has a sarcomeric gene mutation or a HCM phenocopy.  In addition, we discussed the protections afforded by the Genetic Information Non-Discrimination Act (GINA). I explained to him that GINA protects him from losing his employment or health insurance based on his genotype. However, these protections do not cover life insurance and disability. He verbalized understanding of this and states that both daughters have life insurance.  Please note that the patient has not been counseled in this visit on personal, cultural or ethical issues that he may face due to his heart condition.   Plan After a thorough discussion of the risk and benefits of genetic testing for HCM, Brady Johnson states his intent to pursue genetic testing for HCM and signed the informed consent form. Blood was drawn today for testing.     Lattie Corns, Ph.D, [facility] Clinical Molecular Geneticist

## 2019-09-05 ENCOUNTER — Other Ambulatory Visit: Payer: Self-pay

## 2019-09-05 ENCOUNTER — Ambulatory Visit (HOSPITAL_COMMUNITY): Payer: BC Managed Care – PPO | Attending: Cardiology

## 2019-09-05 DIAGNOSIS — I422 Other hypertrophic cardiomyopathy: Secondary | ICD-10-CM | POA: Diagnosis not present

## 2019-09-05 MED ORDER — PERFLUTREN LIPID MICROSPHERE
1.0000 mL | INTRAVENOUS | Status: AC | PRN
  Administered 2019-09-05: 1 mL via INTRAVENOUS

## 2019-10-05 ENCOUNTER — Ambulatory Visit: Payer: BC Managed Care – PPO | Admitting: Internal Medicine

## 2019-10-19 ENCOUNTER — Ambulatory Visit: Payer: BC Managed Care – PPO | Admitting: Internal Medicine

## 2020-01-21 NOTE — Progress Notes (Signed)
Office Visit    Patient Name: Brady Johnson Date of Encounter: 01/22/2020  Primary Care Provider:  [facility] Primary Cardiologist:  Yvonne Kendall, MD Electrophysiologist:  Sherryl Manges, MD   Chief Complaint    Brady Johnson is a 59 y.o. male with a hx of hypertrophic cardiomyopathy, HTN, HLD, OSA, gout presents today for follow-up of hypertrophic cardiomyopathy  Past Medical History    Past Medical History:  Diagnosis Date  . Erectile dysfunction   . Gout   . Hyperandrogenism   . Hyperlipidemia   . Hypertension   . Hypertrophic cardiomyopathy (HCC)   . Sleep apnea    Past Surgical History:  Procedure Laterality Date  . CHOANAL ADENIODECTOMY    . COLONOSCOPY WITH PROPOFOL N/A 05/13/2015   Procedure: COLONOSCOPY WITH PROPOFOL;  Surgeon: Christena Deem, MD;  Location: [facility] ENDOSCOPY;  Service: Endoscopy;  Laterality: N/A;  . LEFT HEART CATH AND CORONARY ANGIOGRAPHY N/A 03/29/2019   Procedure: LEFT HEART CATH AND CORONARY ANGIOGRAPHY and possible PCI and stent;  Surgeon: Alwyn Pea, MD;  Location: [facility] INVASIVE CV LAB;  Service: Cardiovascular;  Laterality: N/A;  . TONSILLECTOMY    . TONSILLECTOMY AND ADENOIDECTOMY      Allergies  No Known Allergies  History of Present Illness    Brady Johnson is a 59 y.o. male with a hx of hypertrophic cardiomyopathy, HTN, HLD, OSA, gout last seen by Dr. Graciela Husbands 08/22/2019.  Previous LHC December 2020 with no significant coronary artery disease and LVEF 60%.  Most recent echo 04/2019 normal LV size with moderate severe asymmetric LVH.  EF 60 to 65% with grade 1 diastolic dysfunction.  Mild resting LVOT gradient noted (~15 mmHg).  RV normal size and function.  Moderate LA enlargement.  Mild aortic root dilation (4.0 cm). Event monitor 07/18/19 with single 7-beat run of NSVT. 07/26/19 his atorvastatin was reduced to 20 mg daily due to well-controlled cholesterol and minimal coronary artery  disease at time of his catheterization in December 2020.  Seen by Dr. Graciela Husbands 08/22/2019.  It was recommended for repeat echo with Valsalva to confirm outflow obstruction.  As his LGE percentage is less than 10% and NSVT was short and slow, no indication for ICD.  Echo 09/05/2019 with severe LVH (most prominent in septum and apex), gr2DD, mildly dilated ascending aorta, trace AI, chordal SAM but no significant LVOT at rest or with valsalva, mild LAE.  Findings consistent with HCM.  Presents today for follow up. He has an appointment with Genetic Counseling next month. Reports no chest pain, pressure, or tightness. No edema, orthopnea, PND. Reports no  lightheadedness, dizziness, near-syncope, syncope. He is not short of breath at rest. He has been walking a exercise 1.5 mild 5 days a week while working with [employer].  Endorses eating a heart healthy diet.  BP at home when checked has been 150-160/80 when checked at night. He worries that his BP cuff is incorrect. It is an arm cuff - he thinks that the batteries may be running out.  EKGs/Labs/Other Studies Reviewed:   The following studies were reviewed today:  Echo 09/05/2019  1. Definity used; hyperdynamic LV systolic function; severe LVH (most  prominent in the septum and apex); grade 2 diastolic dysfunction; mildly  dilated ascending aorta; trace AI; chordal SAM but no significant LVOT  gradient at rest or with valsalva; mild   LAE; findings suggest HCM.   2. Left ventricular ejection fraction,  by estimation, is >75%. The left  ventricle has hyperdynamic function. The left ventricle has no regional  wall motion abnormalities. There is severe left ventricular hypertrophy.  Left ventricular diastolic  parameters are consistent with Grade II diastolic dysfunction  (pseudonormalization). Elevated left atrial pressure.   3. Right ventricular systolic function is normal. The right ventricular  size is normal.   4. Left atrial size  was mildly dilated.   5. The mitral valve is normal in structure. Trivial mitral valve  regurgitation. No evidence of mitral stenosis.   6. The aortic valve is tricuspid. Aortic valve regurgitation is trivial.  No aortic stenosis is present.   7. Aortic dilatation noted. There is mild dilatation of the ascending  aorta measuring 41 mm.   8. The inferior vena cava is normal in size with greater than 50%  respiratory variability, suggesting right atrial pressure of 3 mmHg.   EKG:  EKG is ordered today.  The ekg ordered today demonstrates NSR 76 bpm with PAC. LVH with abnormal repolarization.  Stable T wave inversion in leads V4 through V6, V2 through V3 , aVL though more pronounced than prior. Noted stable left atrial enlargement.  Recent Labs: 03/29/2019: Hemoglobin 15.3; Platelets 231 05/17/2019: BUN 16; Creatinine, Ser 1.33; Potassium 4.1; Sodium 143 07/25/2019: ALT 15  Recent Lipid Panel    Component Value Date/Time   CHOL 131 07/25/2019 0717   TRIG 111 07/25/2019 0717   HDL 38 (L) 07/25/2019 0717   CHOLHDL 3.4 07/25/2019 0717   VLDL 22 07/25/2019 0717   LDLCALC 71 07/25/2019 0717    Home Medications   Current Meds  Medication Sig  . amLODipine (NORVASC) 5 MG tablet Take 1 tablet (5 mg total) by mouth daily.  Marland Kitchen aspirin EC 81 MG tablet Take 81 mg by mouth daily.  Marland Kitchen atorvastatin (LIPITOR) 20 MG tablet Take 1 tablet (20 mg total) by mouth daily.  . colchicine 0.6 MG tablet Take 1 tablet by mouth daily as needed.   Marland Kitchen losartan (COZAAR) 100 MG tablet Take 100 mg by mouth daily.  . metoprolol succinate (TOPROL-XL) 50 MG 24 hr tablet Take 1 tablet (50 mg total) by mouth daily.  . sildenafil (REVATIO) 20 MG tablet Take 20 mg by mouth as needed.  . [DISCONTINUED] metoprolol succinate (TOPROL-XL) 25 MG 24 hr tablet Take 1 tablet (25 mg total) by mouth daily.    Review of Systems    Review of Systems  Constitutional: Negative for chills, fever and malaise/fatigue.  Cardiovascular:  Positive for dyspnea on exertion. Negative for chest pain, leg swelling, near-syncope, orthopnea, palpitations and syncope.  Respiratory: Negative for cough, shortness of breath and wheezing.   Gastrointestinal: Negative for nausea and vomiting.  Neurological: Negative for dizziness, light-headedness and weakness.   All other systems reviewed and are otherwise negative except as noted above.  Physical Exam    VS:  BP (!) 142/80 (BP Location: Left Arm, Patient Position: Sitting, Cuff Size: Normal)   Pulse 76   Ht 5\' 7"  (1.702 m)   Wt 202 lb 8 oz (91.9 kg)   SpO2 98%   BMI 31.72 kg/m  , BMI Body mass index is 31.72 kg/m. GEN: Well nourished, well developed, in no acute distress. HEENT: normal. Neck: Supple, no JVD, carotid bruits, or masses. Cardiac: RRR, no rubs, or gallops. 2/6 systolic murmur. No clubbing, cyanosis, edema.  Radials/DP/PT 2+ and equal bilaterally.  Respiratory:  Respirations regular and unlabored, clear to auscultation bilaterally. GI: Soft, nontender, nondistended, BS +  x 4. MS: No deformity or atrophy. Skin: Warm and dry, no rash. Neuro:  Strength and sensation are intact. Psych: Normal affect. Assessment & Plan    1. Hypertrophic cardiomyopathy-  EKG today NSR 76 bpm with left atrial enlargement, LVH with repolarization abnormality and ST/T wave changes consistent with HCM.  Not to moderate exercise intolerance that was able to walk 1.5 miles 5 times per week.  For optimization of GDMT for HTN, increase Toprol to 50 mg.  Future considerations include addition of Verapamil if HR will allow. Ideally would like to discontinue Losartan to prevent reduced afterload.   2. 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  Per EP due to slow rate and low burden, no indication for ICD at this time.  EKG today with few PAC, increase Toprol to 50 mg.  3. HTN-BP mildly elevated in clinic today.  Reports home SBP 150-160 though he questiosn of accuracy of his cuff.  Encouraged to bring  with him to next appointment.  Continue amlodipine 5 mg daily, losartan 100 mg daily.  Increase Toprol to 50 mg daily.  Ideally would like to discontinue Losartan to prevent reduced afterload in the setting of HCM.   4. HLD-LHC 03/2019 with only mild luminal irregularities.  LDL goal less than 100.  07/2019 LDL 71.  Continue atorvastatin 20 mg daily.  Disposition: Follow up in 2 month(s) with Dr. Okey Dupre or APP  Alver Sorrow, NP 01/22/2020, 8:59 AM

## 2020-01-22 ENCOUNTER — Ambulatory Visit: Payer: BC Managed Care – PPO | Admitting: Internal Medicine

## 2020-01-22 ENCOUNTER — Encounter: Payer: Self-pay | Admitting: Family

## 2020-01-22 ENCOUNTER — Other Ambulatory Visit: Payer: Self-pay

## 2020-01-22 ENCOUNTER — Ambulatory Visit: Payer: BC Managed Care – PPO | Admitting: Family

## 2020-01-22 VITALS — BP 142/80 | HR 76 | Ht 67.0 in | Wt 202.5 lb

## 2020-01-22 DIAGNOSIS — I422 Other hypertrophic cardiomyopathy: Secondary | ICD-10-CM | POA: Diagnosis not present

## 2020-01-22 DIAGNOSIS — I471 Supraventricular tachycardia, unspecified: Secondary | ICD-10-CM

## 2020-01-22 DIAGNOSIS — I10 Essential (primary) hypertension: Secondary | ICD-10-CM

## 2020-01-22 DIAGNOSIS — E7800 Pure hypercholesterolemia, unspecified: Secondary | ICD-10-CM | POA: Diagnosis not present

## 2020-01-22 DIAGNOSIS — I491 Atrial premature depolarization: Secondary | ICD-10-CM

## 2020-01-22 DIAGNOSIS — I472 Ventricular tachycardia: Secondary | ICD-10-CM

## 2020-01-22 DIAGNOSIS — I4729 Other ventricular tachycardia: Secondary | ICD-10-CM

## 2020-01-22 MED ORDER — METOPROLOL SUCCINATE ER 50 MG PO TB24: 50.0000 mg | ORAL_TABLET | Freq: Every day | ORAL | 1 refills | Status: DC

## 2020-01-22 NOTE — Patient Instructions (Addendum)
Medication Instructions:  Your physician has recommended you make the following change in your medication:   CHANGE your Metoprolol Succinate to 50mg  daily *You may use up your 25 mg tablet by taking two of them in the morning.  *If you need a refill on your cardiac medications before your next appointment, please call your pharmacy*   Lab Work: None ordered today.   Testing/Procedures: Your EKG today was stable compared to previous.   Follow-Up: At [facility], you and your health needs are our priority.  As part of our continuing mission to provide you with exceptional heart care, we have created designated Provider Care Teams.  These Care Teams include your primary Cardiologist (physician) and Advanced Practice Providers (APPs -  Physician Assistants and Nurse Practitioners) who all work together to provide you with the care you need, when you need it.  We recommend signing up for the patient portal called "MyChart".  Sign up information is provided on this After Visit Summary.  MyChart is used to connect with patients for Virtual Visits (Telemedicine).  Patients are able to view lab/test results, encounter notes, upcoming appointments, etc.  Non-urgent messages can be sent to your provider as well.   To learn more about what you can do with MyChart, go to [facility].    Your next appointment:   2 month(s)  The format for your next appointment:   In Person  Provider:   You may see [email or web address], MD or one of the following Advanced Practice Providers on your designated Care Team:    Yvonne Kendall, NP  Nicolasa Ducking, PA-C  Eula Listen, PA-C  Cadence Marisue Ivan, Fransico Michael   Other Instructions  Check your BP at home and keep a log. Bring your BP cuff with you to your next appointment.   Keep up the good work with your walking!

## 2020-02-22 ENCOUNTER — Encounter: Payer: BC Managed Care – PPO | Admitting: Genetic Counselor

## 2020-03-25 ENCOUNTER — Ambulatory Visit: Payer: BC Managed Care – PPO | Admitting: Family

## 2020-03-25 ENCOUNTER — Encounter: Payer: Self-pay | Admitting: Family

## 2020-03-25 ENCOUNTER — Other Ambulatory Visit: Payer: Self-pay

## 2020-03-25 VITALS — BP 130/80 | HR 62 | Ht 67.0 in | Wt 208.0 lb

## 2020-03-25 DIAGNOSIS — I471 Supraventricular tachycardia, unspecified: Secondary | ICD-10-CM

## 2020-03-25 DIAGNOSIS — I422 Other hypertrophic cardiomyopathy: Secondary | ICD-10-CM | POA: Diagnosis not present

## 2020-03-25 DIAGNOSIS — I4729 Other ventricular tachycardia: Secondary | ICD-10-CM

## 2020-03-25 DIAGNOSIS — E782 Mixed hyperlipidemia: Secondary | ICD-10-CM | POA: Diagnosis not present

## 2020-03-25 DIAGNOSIS — I472 Ventricular tachycardia: Secondary | ICD-10-CM

## 2020-03-25 DIAGNOSIS — I10 Essential (primary) hypertension: Secondary | ICD-10-CM

## 2020-03-25 NOTE — Patient Instructions (Addendum)
Medication Instructions:  No medication changes today.   *If you need a refill on your cardiac medications before your next appointment, please call your pharmacy*  Lab Work: No lab work today.   Testing/Procedures: None ordered today.   Follow-Up: At [facility], you and your health needs are our priority.  As part of our continuing mission to provide you with exceptional heart care, we have created designated Provider Care Teams.  These Care Teams include your primary Cardiologist (physician) and Advanced Practice Providers (APPs -  Physician Assistants and Nurse Practitioners) who all work together to provide you with the care you need, when you need it.   Your next appointment:   4 month(s)  The format for your next appointment:   In Person  Provider:   You may see Yvonne Kendall, MD or one of the following Advanced Practice Providers on your designated Care Team:    Nicolasa Ducking, NP  Eula Listen, PA-C  Marisue Ivan, PA-C  Cadence Logan, [place]  Gillian Shields, NP  Other Instructions DASH Eating Plan DASH stands for "Dietary Approaches to Stop Hypertension." The DASH eating plan is a healthy eating plan that has been shown to reduce high blood pressure (hypertension). It may also reduce your risk for type 2 diabetes, heart disease, and stroke. The DASH eating plan may also help with weight loss. What are tips for following this plan?  General guidelines  Avoid eating more than 2,300 mg (milligrams) of salt (sodium) a day. If you have hypertension, you may need to reduce your sodium intake to 1,500 mg a day.  Limit alcohol intake to no more than 1 drink a day for nonpregnant women and 2 drinks a day for men. One drink equals 12 oz of beer, 5 oz of wine, or 1 oz of hard liquor.  Work with your health care provider to maintain a healthy body weight or to lose weight. Ask what an ideal weight is for you.  Get at least 30 minutes of exercise that causes your  heart to beat faster (aerobic exercise) most days of the week. Activities may include walking, swimming, or biking.  Work with your health care provider or diet and nutrition specialist (dietitian) to adjust your eating plan to your individual calorie needs. Reading food labels   Check food labels for the amount of sodium per serving. Choose foods with less than 5 percent of the Daily Value of sodium. Generally, foods with less than 300 mg of sodium per serving fit into this eating plan.  To find whole grains, look for the word "whole" as the first word in the ingredient list. Shopping  Buy products labeled as "low-sodium" or "no salt added."  Buy fresh foods. Avoid canned foods and premade or frozen meals. Cooking  Avoid adding salt when cooking. Use salt-free seasonings or herbs instead of table salt or sea salt. Check with your health care provider or pharmacist before using salt substitutes.  Do not fry foods. Cook foods using healthy methods such as baking, boiling, grilling, and broiling instead.  Cook with heart-healthy oils, such as olive, canola, soybean, or sunflower oil. Meal planning  Eat a balanced diet that includes: ? 5 or more servings of fruits and vegetables each day. At each meal, try to fill half of your plate with fruits and vegetables. ? Up to 6-8 servings of whole grains each day. ? Less than 6 oz of lean meat, poultry, or fish each day. A 3-oz serving of meat is  about the same size as a deck of cards. One egg equals 1 oz. ? 2 servings of low-fat dairy each day. ? A serving of nuts, seeds, or beans 5 times each week. ? Heart-healthy fats. Healthy fats called Omega-3 fatty acids are found in foods such as flaxseeds and coldwater fish, like sardines, salmon, and mackerel.  Limit how much you eat of the following: ? Canned or prepackaged foods. ? Food that is high in trans fat, such as fried foods. ? Food that is high in saturated fat, such as fatty  meat. ? Sweets, desserts, sugary drinks, and other foods with added sugar. ? Full-fat dairy products.  Do not salt foods before eating.  Try to eat at least 2 vegetarian meals each week.  Eat more home-cooked food and less restaurant, buffet, and fast food.  When eating at a restaurant, ask that your food be prepared with less salt or no salt, if possible. What foods are recommended? The items listed may not be a complete list. Talk with your dietitian about what dietary choices are best for you. Grains Whole-grain or whole-wheat bread. Whole-grain or whole-wheat pasta. Brown rice. Orpah Cobb. Bulgur. Whole-grain and low-sodium cereals. Pita bread. Low-fat, low-sodium crackers. Whole-wheat flour tortillas. Vegetables Fresh or frozen vegetables (raw, steamed, roasted, or grilled). Low-sodium or reduced-sodium tomato and vegetable juice. Low-sodium or reduced-sodium tomato sauce and tomato paste. Low-sodium or reduced-sodium canned vegetables. Fruits All fresh, dried, or frozen fruit. Canned fruit in natural juice (without added sugar). Meat and other protein foods Skinless chicken or [place]. Ground chicken or [place]. Pork with fat trimmed off. Fish and seafood. Egg whites. Dried beans, peas, or lentils. Unsalted nuts, nut butters, and seeds. Unsalted canned beans. Lean cuts of beef with fat trimmed off. Low-sodium, lean deli meat. Dairy Low-fat (1%) or fat-free (skim) milk. Fat-free, low-fat, or reduced-fat cheeses. Nonfat, low-sodium ricotta or cottage cheese. Low-fat or nonfat yogurt. Low-fat, low-sodium cheese. Fats and oils Soft margarine without trans fats. Vegetable oil. Low-fat, reduced-fat, or light mayonnaise and salad dressings (reduced-sodium). Canola, safflower, olive, soybean, and sunflower oils. Avocado. Seasoning and other foods Herbs. Spices. Seasoning mixes without salt. Unsalted popcorn and pretzels. Fat-free sweets. What foods are not recommended? The items listed  may not be a complete list. Talk with your dietitian about what dietary choices are best for you. Grains Baked goods made with fat, such as croissants, muffins, or some breads. Dry pasta or rice meal packs. Vegetables Creamed or fried vegetables. Vegetables in a cheese sauce. Regular canned vegetables (not low-sodium or reduced-sodium). Regular canned tomato sauce and paste (not low-sodium or reduced-sodium). Regular tomato and vegetable juice (not low-sodium or reduced-sodium). Rosita Fire. Olives. Fruits Canned fruit in a light or heavy syrup. Fried fruit. Fruit in cream or butter sauce. Meat and other protein foods Fatty cuts of meat. Ribs. Fried meat. Tomasa Blase. Sausage. Bologna and other processed lunch meats. Salami. Fatback. Hotdogs. Bratwurst. Salted nuts and seeds. Canned beans with added salt. Canned or smoked fish. Whole eggs or egg yolks. Chicken or [place] with skin. Dairy Whole or 2% milk, cream, and half-and-half. Whole or full-fat cream cheese. Whole-fat or sweetened yogurt. Full-fat cheese. Nondairy creamers. Whipped toppings. Processed cheese and cheese spreads. Fats and oils Butter. Stick margarine. Lard. Shortening. Ghee. Bacon fat. Tropical oils, such as coconut, palm kernel, or palm oil. Seasoning and other foods Salted popcorn and pretzels. Onion salt, garlic salt, seasoned salt, table salt, and sea salt. Worcestershire sauce. Tartar sauce. Barbecue sauce. Teriyaki sauce. Soy  sauce, including reduced-sodium. Steak sauce. Canned and packaged gravies. Fish sauce. Oyster sauce. Cocktail sauce. Horseradish that you find on the shelf. Ketchup. Mustard. Meat flavorings and tenderizers. Bouillon cubes. Hot sauce and Tabasco sauce. Premade or packaged marinades. Premade or packaged taco seasonings. Relishes. Regular salad dressings. Where to find more information:  National Heart, Lung, and Blood Institute: PopSteam.is  American Heart Association: www.heart.org Summary  The DASH  eating plan is a healthy eating plan that has been shown to reduce high blood pressure (hypertension). It may also reduce your risk for type 2 diabetes, heart disease, and stroke.  With the DASH eating plan, you should limit salt (sodium) intake to 2,300 mg a day. If you have hypertension, you may need to reduce your sodium intake to 1,500 mg a day.  When on the DASH eating plan, aim to eat more fresh fruits and vegetables, whole grains, lean proteins, low-fat dairy, and heart-healthy fats.  Work with your health care provider or diet and nutrition specialist (dietitian) to adjust your eating plan to your individual calorie needs. This information is not intended to replace advice given to you by your health care provider. Make sure you discuss any questions you have with your health care provider. Document Revised: 03/12/2017 Document Reviewed: 03/23/2016 Elsevier Patient Education  2020 [employer].

## 2020-03-25 NOTE — Progress Notes (Signed)
Office Visit    Patient Name: Brady Johnson Date of Encounter: 03/25/2020  Primary Care Provider:  [facility] Primary Cardiologist:  Yvonne Kendall, MD Electrophysiologist:  Sherryl Manges, MD   Chief Complaint    Brady Johnson is a 59 y.o. male with a hx of hypertrophic cardiomyopathy, HTN, HLD, OSA, gout presents today for follow-up of hypertrophic cardiomyopathy  Past Medical History    Past Medical History:  Diagnosis Date  . Erectile dysfunction   . Gout   . Hyperandrogenism   . Hyperlipidemia   . Hypertension   . Hypertrophic cardiomyopathy (HCC)   . Sleep apnea    Past Surgical History:  Procedure Laterality Date  . CHOANAL ADENIODECTOMY    . COLONOSCOPY WITH PROPOFOL N/A 05/13/2015   Procedure: COLONOSCOPY WITH PROPOFOL;  Surgeon: Christena Deem, MD;  Location: [facility] ENDOSCOPY;  Service: Endoscopy;  Laterality: N/A;  . LEFT HEART CATH AND CORONARY ANGIOGRAPHY N/A 03/29/2019   Procedure: LEFT HEART CATH AND CORONARY ANGIOGRAPHY and possible PCI and stent;  Surgeon: Alwyn Pea, MD;  Location: [facility] INVASIVE CV LAB;  Service: Cardiovascular;  Laterality: N/A;  . TONSILLECTOMY    . TONSILLECTOMY AND ADENOIDECTOMY      Allergies  No Known Allergies  History of Present Illness    Brady Johnson is a 59 y.o. male with a hx of hypertrophic cardiomyopathy, HTN, HLD, OSA, gout last seen by Dr. Graciela Husbands 01/22/2020.  Previous LHC December 2020 with no significant coronary artery disease and LVEF 60%.  Most recent echo 04/2019 normal LV size with moderate severe asymmetric LVH.  EF 60 to 65% with grade 1 diastolic dysfunction.  Mild resting LVOT gradient noted (~15 mmHg).  RV normal size and function.  Moderate LA enlargement.  Mild aortic root dilation (4.0 cm). Event monitor 07/18/19 with single 7-beat run of NSVT. 07/26/19 his atorvastatin was reduced to 20 mg daily due to well-controlled cholesterol and minimal coronary artery  disease at time of his catheterization in December 2020.  Seen by Dr. Graciela Husbands 08/22/2019.  It was recommended for repeat echo with Valsalva to confirm outflow obstruction.  As his LGE percentage is less than 10% and NSVT was short and slow, no indication for ICD.  Echo 09/05/2019 with severe LVH (most prominent in septum and apex), gr2DD, mildly dilated ascending aorta, trace AI, chordal SAM but no significant LVOT at rest or with valsalva, mild LAE.  Findings consistent with HCM.  Seen in follow-up 01/22/2020.  He was doing overall well and walking 1.5 miles 5 days a week while working with the [employer].  His Toprol was increased to 50 mg daily.    He had lab work collected by his primary care provider 02/17/2020 detailed below   02/14/2020 total cholesterol 155, triglycerides 194, HDL 37, LDL 89  02/14/20 creatinine 1.22, GFR 75, K4.3, NA 143, AST 15, ALT 13  02/13/2020  Hemoglobin 15, HCT 44.5  He presents today for follow-up.  Tells me his blood pressure and exercise tolerance have improved since increasing his dose of Toprol.  No lightheadedness, dizziness, near-syncope, syncope.  He continues to walk 1 to 1.5 miles per day at the [employer].  Tells me he has made changes to his diet and continues to try and avoid salt.  EKGs/Labs/Other Studies Reviewed:   The following studies were reviewed today:  Echo 09/05/2019  1. Definity used; hyperdynamic LV systolic function; severe LVH (most  prominent  in the septum and apex); grade 2 diastolic dysfunction; mildly  dilated ascending aorta; trace AI; chordal SAM but no significant LVOT  gradient at rest or with valsalva; mild   LAE; findings suggest HCM.   2. Left ventricular ejection fraction, by estimation, is >75%. The left  ventricle has hyperdynamic function. The left ventricle has no regional  wall motion abnormalities. There is severe left ventricular hypertrophy.  Left ventricular diastolic  parameters are consistent  with Grade II diastolic dysfunction  (pseudonormalization). Elevated left atrial pressure.   3. Right ventricular systolic function is normal. The right ventricular  size is normal.   4. Left atrial size was mildly dilated.   5. The mitral valve is normal in structure. Trivial mitral valve  regurgitation. No evidence of mitral stenosis.   6. The aortic valve is tricuspid. Aortic valve regurgitation is trivial.  No aortic stenosis is present.   7. Aortic dilatation noted. There is mild dilatation of the ascending  aorta measuring 41 mm.   8. The inferior vena cava is normal in size with greater than 50%  respiratory variability, suggesting right atrial pressure of 3 mmHg.   EKG:  No EKG is ordered today.  The ekg independently reviewed from 01/12/2020 demonstrated NSR 76 bpm with PAC. LVH with abnormal repolarization.  Stable T wave inversion in leads V4 through V6, V2 through V3 , aVL though more pronounced than prior. Noted stable left atrial enlargement.  Recent Labs: 03/29/2019: Hemoglobin 15.3; Platelets 231 05/17/2019: BUN 16; Creatinine, Ser 1.33; Potassium 4.1; Sodium 143 07/25/2019: ALT 15  Recent Lipid Panel    Component Value Date/Time   CHOL 131 07/25/2019 0717   TRIG 111 07/25/2019 0717   HDL 38 (L) 07/25/2019 0717   CHOLHDL 3.4 07/25/2019 0717   VLDL 22 07/25/2019 0717   LDLCALC 71 07/25/2019 0717   Home Medications   Current Meds  Medication Sig  . amLODipine (NORVASC) 5 MG tablet Take 1 tablet (5 mg total) by mouth daily.  Marland Kitchen aspirin EC 81 MG tablet Take 81 mg by mouth daily.  Marland Kitchen atorvastatin (LIPITOR) 20 MG tablet Take 1 tablet (20 mg total) by mouth daily.  . colchicine 0.6 MG tablet Take 1 tablet by mouth daily as needed.   Marland Kitchen losartan (COZAAR) 100 MG tablet Take 100 mg by mouth daily.  . metoprolol succinate (TOPROL-XL) 50 MG 24 hr tablet Take 1 tablet (50 mg total) by mouth daily.  . sildenafil (REVATIO) 20 MG tablet Take 20 mg by mouth as needed.    Review of  Systems   All other systems reviewed and are otherwise negative except as noted above.  Physical Exam   VS:  BP 130/80 (BP Location: Left Arm, Patient Position: Sitting, Cuff Size: Normal)   Pulse 62   Ht 5\' 7"  (1.702 m)   Wt 208 lb (94.3 kg)   SpO2 98%   BMI 32.58 kg/m  , BMI Body mass index is 32.58 kg/m. GEN: Well nourished, well developed, in no acute distress. HEENT: normal. Neck: Supple, no JVD, carotid bruits, or masses. Cardiac: RRR, no rubs, or gallops. 2/6 systolic murmur. No clubbing, cyanosis, edema.  Radials/DP/PT 2+ and equal bilaterally.  Respiratory:  Respirations regular and unlabored, clear to auscultation bilaterally. GI: Soft, nontender, nondistended. MS: No deformity or atrophy. Skin: Warm and dry, no rash. Neuro:  Strength and sensation are intact. Psych: Normal affect.  Assessment & Plan    1. Hypertrophic cardiomyopathy - Reports improvement in DOE since increased  dose of Toprol.  Exercising by walking 1-1.5 miles 5 days/week without difficulty.  He has follow-up with genetics clinic next month.  Present GDMT includes Toprol 50 mg daily.  Unable to further escalate or add verapamil due to heart rate 62 bpm today.  Avoid vasodilator and afterload reducing agents.  2. PSVT/NSVT-Per EP visit 08/22/2019 due to slow rate and low burden, no indication for ICD at this time.  Toprol uptitrated to 50 mg daily at last clinic visit.  Unable to further uptitrate due to heart rate 62 bpm today.  Denies palpitations.  3. HTN- BP well controlled. Continue current antihypertensive regimen including Toprol 50 mg daily, losartan 100 mg daily, amlodipine 5 mg daily.  4. HLD-LHC 03/2019 with only mild luminal irregularities.  LDL goal less than 100.  02/2020 LDL 89, normal liver function 02/2020.Marland Kitchen  Continue atorvastatin 20 mg daily.  Disposition: Follow up in 4 month(s) with Dr. Okey Dupre or APP  Alver Sorrow, NP 03/25/2020, 9:04 AM

## 2020-03-26 ENCOUNTER — Other Ambulatory Visit: Payer: Self-pay | Admitting: Internal Medicine

## 2020-03-26 NOTE — Telephone Encounter (Signed)
Rx request sent to pharmacy.  

## 2020-04-10 ENCOUNTER — Other Ambulatory Visit: Payer: Self-pay | Admitting: Internal Medicine

## 2020-07-03 ENCOUNTER — Other Ambulatory Visit: Payer: Self-pay | Admitting: Internal Medicine

## 2020-07-19 ENCOUNTER — Other Ambulatory Visit: Payer: Self-pay | Admitting: Family

## 2020-07-19 DIAGNOSIS — I422 Other hypertrophic cardiomyopathy: Secondary | ICD-10-CM

## 2020-07-29 NOTE — Progress Notes (Signed)
Follow-up Outpatient Visit Date: 07/31/2020  Primary Care Provider: [facility] [address] [place] [address]  Chief Complaint: Follow-up hypertrophic cardiomyopathy  HPI:  Brady Johnson is a 60 y.o. male with history of hypertrophic cardiomyopathy (previously seen by genetics and EP), hypertension, hyperlipidemia, obstructive sleep apnea, and gout, who presents for follow-up of hypertrophic cardiomyopathy.  He was last seen in our office in 03/2020 by Gillian Shields, NP, at which time he reported improvements in blood pressure and exercise tolerance with escalation of metoprolol succinate.  No further medication changes or additional testing were pursued.  Today, Brady Johnson reports that he has been feeling relatively well.  He is trying to be more active and notes 1 episode of palpitations/lightheadedness when running.  He did not pass out.  He is otherwise able to walk and do yard work without symptoms.  He has not had any chest pain, shortness of breath, syncope, or edema.  He is tolerating his medications well.  --------------------------------------------------------------------------------------------------  Cardiovascular History & Procedures: Cardiovascular Problems:  Elevated troponin and abnormal EKG(suspect hypertrophic cardiomyopathy)  Risk Factors:  Hypertension, hyperlipidemia, obesity, male gender,family historyand age >59  Cath/PCI:  LHC (03/29/2019): No significant coronary artery disease. LVEF 60%.  CV Surgery:  None  EP Procedures and Devices:  Event monitor (07/18/2019): Predominantly sinus rhythm with occasional PACs and rare PVCs.  Single episode of NSVT lasting 7 beats was observed, as well as atrial runs lasting up to 12 beats.  Non-Invasive Evaluation(s):  TTE (09/05/2019): Normal LV size with severe LVH.  LVEF greater than 75% with grade 2 diastolic dysfunction.  Left atrial pressure elevated.  Normal RV size and  function.  Mild left atrial enlargement.  No significant valvular abnormality.  Mild dilation of ascending aorta noted, measuring 4.1 cm.  TTE (05/11/19): Normal LV size with moderate to severe, asymmetric left ventricular hypertrophy. LVEF 60-65% with grade 1 diastolic dysfunction. Mild resting LVOT gradient noted (~15 mmHg). Normal RV size and function. Moderate left atrial enlargement. Mild aortic root dilation (4.0 cm).   Recent CV Pertinent Labs: Lab Results  Component Value Date   CHOL 131 07/25/2019   HDL 38 (L) 07/25/2019   LDLCALC 71 07/25/2019   TRIG 111 07/25/2019   CHOLHDL 3.4 07/25/2019   INR 1.0 03/29/2019   K 4.1 05/17/2019   K 3.6 06/25/2012   BUN 16 05/17/2019   BUN 17 06/25/2012   CREATININE 1.33 (H) 05/17/2019   CREATININE 1.33 (H) 06/25/2012    Past medical and surgical history were reviewed and updated in EPIC.  Current Meds  Medication Sig  . amLODipine (NORVASC) 5 MG tablet TAKE 1 TABLET BY MOUTH EVERY DAY  . aspirin EC 81 MG tablet Take 81 mg by mouth daily.  Marland Kitchen atorvastatin (LIPITOR) 20 MG tablet TAKE 1 TABLET BY MOUTH EVERY DAY  . colchicine 0.6 MG tablet Take 1 tablet by mouth daily as needed.   Marland Kitchen losartan (COZAAR) 100 MG tablet Take 100 mg by mouth daily.  . metoprolol succinate (TOPROL-XL) 50 MG 24 hr tablet TAKE 1 TABLET BY MOUTH EVERY DAY  . sildenafil (REVATIO) 20 MG tablet Take 20 mg by mouth as needed.    Allergies: Patient has no known allergies.  Social History   Tobacco Use  . Smoking status: Never Smoker  . Smokeless tobacco: Never Used  Vaping Use  . Vaping Use: Never used  Substance Use Topics  . Alcohol use: No  . Drug use: No  Family History  Problem Relation Age of Onset  . Heart attack Father 67  . Heart attack Nephew 33  . Heart failure Neg Hx   . Sudden Cardiac Death Neg Hx     Review of Systems: A 12-system review of systems was performed and was negative except as noted in the  HPI.  --------------------------------------------------------------------------------------------------  Physical Exam: BP 124/86 (BP Location: Left Arm, Patient Position: Sitting, Cuff Size: Normal)   Pulse 66   Ht 5\' 8"  (1.727 m)   Wt 207 lb (93.9 kg)   SpO2 98%   BMI 31.47 kg/m   General:  NAD. Neck: No JVD or HJR. Lungs: Clear to auscultation bilaterally without wheezes or crackles. Heart: Regular rate and rhythm with 1/6 systolic murmur.  No rubs or gallops. Abdomen: Soft, nontender, nondistended. Extremities: No lower extremity edema.  EKG: Sinus bradycardia with PACs, left atrial enlargement, and LVH with abnormal repolarization consistent with hypertrophic cardiomyopathy.  Heart rate has decreased since 01/22/2020.  Otherwise, no significant interval change.  Lab Results  Component Value Date   WBC 7.2 03/29/2019   HGB 15.3 03/29/2019   HCT 45.6 03/29/2019   MCV 90.3 03/29/2019   PLT 231 03/29/2019    Lab Results  Component Value Date   NA 143 05/17/2019   K 4.1 05/17/2019   CL 108 05/17/2019   CO2 25 05/17/2019   BUN 16 05/17/2019   CREATININE 1.33 (H) 05/17/2019   GLUCOSE 95 05/17/2019   ALT 15 07/25/2019    Lab Results  Component Value Date   CHOL 131 07/25/2019   HDL 38 (L) 07/25/2019   LDLCALC 71 07/25/2019   TRIG 111 07/25/2019   CHOLHDL 3.4 07/25/2019    --------------------------------------------------------------------------------------------------  ASSESSMENT AND PLAN: Hypertrophic cardiomyopathy: Brady Johnson is doing well and largely asymptomatic.  He notes 1 episode of palpitations and lightheadedness when he was running but otherwise is able to exercise and do strenuous activities around the house without any difficulty.  He has previously been evaluated by EP and deemed low risk for sudden cardiac death.  Prior event monitor noted 7 beat run of NSVT.  I have recommended that we obtain an exercise tolerance test to exclude exercise-induced  arrhythmia or drop in blood pressure that could suggest higher risk for complications from HCM.  We will defer medication changes at this time, as heart rate is well controlled on metoprolol succinate 50 mg daily.  I encouraged Mr. Villamizar to reach out to Dr. Anne Hahn to discuss results of his genetic testing, as his previous appointment had to be rescheduled.  Hypertension: Blood pressure well controlled today.  Continue current regimen of amlodipine, losartan, and metoprolol.  Dilated ascending aorta: Noted on echo ordered by Dr. Jomarie Longs in 08/2019.  We will discuss follow-up cross-sectional imaging when Mr. Cheuvront returns.  Continue to work on blood pressure control.  Follow-up: Return to clinic in 6 months.  Anne Hahn, MD 07/31/2020 9:05 AM

## 2020-07-31 ENCOUNTER — Encounter: Payer: Self-pay | Admitting: Internal Medicine

## 2020-07-31 ENCOUNTER — Other Ambulatory Visit: Payer: Self-pay

## 2020-07-31 ENCOUNTER — Ambulatory Visit: Payer: BC Managed Care – PPO | Admitting: Internal Medicine

## 2020-07-31 VITALS — BP 124/86 | HR 57 | Ht 68.0 in | Wt 207.0 lb

## 2020-07-31 DIAGNOSIS — I10 Essential (primary) hypertension: Secondary | ICD-10-CM

## 2020-07-31 DIAGNOSIS — R42 Dizziness and giddiness: Secondary | ICD-10-CM

## 2020-07-31 DIAGNOSIS — I422 Other hypertrophic cardiomyopathy: Secondary | ICD-10-CM

## 2020-07-31 NOTE — Patient Instructions (Signed)
Medication Instructions:  Your physician recommends that you continue on your current medications as directed. Please refer to the Current Medication list given to you today.  *If you need a refill on your cardiac medications before your next appointment, please call your pharmacy*   Lab Work: None ordered If you have labs (blood work) drawn today and your tests are completely normal, you will receive your results only by: Marland Kitchen MyChart Message (if you have MyChart) OR . A paper copy in the mail If you have any lab test that is abnormal or we need to change your treatment, we will call you to review the results.   Testing/Procedures: Your physician has requested that you have an exercise tolerance test. For further information please visit [email or web address] Please also follow instruction sheet, as given.     Follow-Up: At [facility], you and your health needs are our priority.  As part of our continuing mission to provide you with exceptional heart care, we have created designated Provider Care Teams.  These Care Teams include your primary Cardiologist (physician) and Advanced Practice Providers (APPs -  Physician Assistants and Nurse Practitioners) who all work together to provide you with the care you need, when you need it.  We recommend signing up for the patient portal called "MyChart".  Sign up information is provided on this After Visit Summary.  MyChart is used to connect with patients for Virtual Visits (Telemedicine).  Patients are able to view lab/test results, encounter notes, upcoming appointments, etc.  Non-urgent messages can be sent to your provider as well.   To learn more about what you can do with MyChart, go to [email or web address].    Your next appointment:   Your physician wants you to follow-up in: 6 months You will receive a reminder letter in the mail two months in advance. If you don't receive a letter, please call our office to schedule the follow-up  appointment.   The format for your next appointment:   In Person  Provider:   You may see Yvonne Kendall, MD or one of the following Advanced Practice Providers on your designated Care Team:    Nicolasa Ducking, NP  Eula Listen, PA-C  Marisue Ivan, PA-C  Cadence Fransico Michael, [place]  Gillian Shields, NP    Other Instructions  Exercise Tolerance test instructions  - you may eat a light breakfast/ lunch prior to your procedure - no caffeine for 24 hours prior to your test (coffee, tea, soft drinks, or chocolate)  - no smoking/ vaping for 4 hours prior to your test - you may take your regular medications the day of your test except for: DO NOT take _Metoprolol for 24 hours prior - bring any inhalers with you to your test - wear comfortable clothing & tennis/ non-skid shoes to walk on the treadmill

## 2020-08-08 NOTE — Addendum Note (Signed)
Addended by: Bryna Colander on: 08/08/2020 01:54 PM   Modules accepted: Orders

## 2020-08-08 NOTE — Addendum Note (Signed)
Addended by: Bladyn Tipps A on: 08/08/2020 08:57 PM   Modules accepted: Orders

## 2020-08-09 ENCOUNTER — Other Ambulatory Visit: Payer: Self-pay

## 2020-08-09 ENCOUNTER — Ambulatory Visit (INDEPENDENT_AMBULATORY_CARE_PROVIDER_SITE_OTHER): Payer: BC Managed Care – PPO

## 2020-08-09 DIAGNOSIS — I422 Other hypertrophic cardiomyopathy: Secondary | ICD-10-CM | POA: Diagnosis not present

## 2020-08-09 DIAGNOSIS — R42 Dizziness and giddiness: Secondary | ICD-10-CM | POA: Diagnosis not present

## 2020-08-12 ENCOUNTER — Telehealth: Payer: Self-pay | Admitting: Internal Medicine

## 2020-08-12 LAB — EXERCISE TOLERANCE TEST
Estimated workload: 11.7 METS
Exercise duration (min): 10 min
Exercise duration (sec): 0 s
MPHR: 162 {beats}/min
Peak HR: 125 {beats}/min
Percent HR: 77 %
RPE: 13
Rest HR: 59 {beats}/min

## 2020-08-12 NOTE — Telephone Encounter (Signed)
Attempted to call the patient. No answer- I left a message to please call back.  

## 2020-08-12 NOTE — Telephone Encounter (Signed)
Yvonne Kendall, MD  08/12/2020 6:52 AM EDT      Please let Mr. Maino know that his stress test did not show any worrisome drops in his blood pressure with exercise, which is what we were looking for on the stress test with his history of hypertrophic cardiomyopathy. The study was not diagnostic for ischemia, though we were not looking for coronary artery disease as his catheterization in 03/2019 showed normal coronary arteries. He should continue his current medications and follow-up as previously arranged.

## 2020-08-13 NOTE — Telephone Encounter (Signed)
Left voicemail message to call back for review of results.  

## 2020-08-13 NOTE — Telephone Encounter (Signed)
Reviewed results and recommendations with patient. He verbalized understanding with no further questions at this time.  

## 2020-09-28 ENCOUNTER — Other Ambulatory Visit: Payer: Self-pay | Admitting: Internal Medicine

## 2020-12-30 ENCOUNTER — Other Ambulatory Visit: Payer: Self-pay | Admitting: Internal Medicine

## 2020-12-30 NOTE — Telephone Encounter (Signed)
Please schedule 6 month F/U appointment. Thank you! 

## 2020-12-31 ENCOUNTER — Other Ambulatory Visit: Payer: Self-pay | Admitting: Internal Medicine

## 2020-12-31 NOTE — Telephone Encounter (Signed)
LVM to schedule appt

## 2021-01-22 ENCOUNTER — Other Ambulatory Visit: Payer: Self-pay | Admitting: Internal Medicine

## 2021-01-22 DIAGNOSIS — I422 Other hypertrophic cardiomyopathy: Secondary | ICD-10-CM

## 2021-01-22 NOTE — Telephone Encounter (Signed)
LVM to schedule

## 2021-01-22 NOTE — Telephone Encounter (Signed)
Please schedule 6 month F/U appointment. Thank you! 

## 2021-02-15 ENCOUNTER — Other Ambulatory Visit: Payer: Self-pay | Admitting: Internal Medicine

## 2021-02-15 DIAGNOSIS — I422 Other hypertrophic cardiomyopathy: Secondary | ICD-10-CM

## 2021-02-17 NOTE — Telephone Encounter (Signed)
Attempted to schedule.  LMOV to call office.  ° °

## 2021-02-17 NOTE — Telephone Encounter (Signed)
Please contact pt for future appointment. Pt due for 6 month f/u. 

## 2021-03-06 NOTE — Progress Notes (Signed)
Cardiology Office Note    Date:  03/12/2021   ID:  Brady Johnson, DOB [birth year], MRN [id number]  PCP:  [facility]  Cardiologist:  Yvonne Kendall, MD  Electrophysiologist:  Sherryl Manges, MD   Chief Complaint: Follow-up  History of Present Illness:   Brady Johnson is a 60 y.o. male with history of normal coronary arteries by LHC in 03/2019, hypertrophic cardiomyopathy, HTN, HLD, OSA, and gout who presents for follow-up of follow-up of hypertrophic cardiomyopathy.  LHC from 03/2019 demonstrated no significant CAD with an LVEF of 60%.  Echo from 04/2019 demonstrated an EF of 60 to 65%, moderate to severely increased LVH, no regional wall motion abnormalities, near cavity obliteration in systole with an LVOT gradient of 15 mmHg with no Valsalva measurements available, grade 1 diastolic dysfunction, normal RV systolic function and ventricular cavity size with no increase in RV wall thickness, moderately dilated left atrium, mild dilatation of the aortic root.  Cardiac MRI in 06/2019 showed asymmetric LVH measuring up to 20 mm in the basal anteroseptum, 10 mm in the posterior wall, as well as significant apical hypertrophy measuring up to 18 mm in the apical inferior wall, consistent with hypertrophic cardiomyopathy.  LVOT flow acceleration with peak resting gradient measured at 16 mmHg.  Patchy LGE in the septum and apex consistent with hypertrophic cardiomyopathy with LGE accounting for 4% of total myocardial mass.  He was referred to cardiac geneticist and EP.  Outpatient cardiac monitoring in 07/2019 demonstrated a predominant rhythm of sinus with an average rate of 69 bpm, 8 atrial runs lasting up to 12 beats, single episode of NSVT lasting 7 beats, and no sustained arrhythmias or prolonged pauses.  He was evaluated by EP in 08/2019 with recommendation to repeat echo with Valsalva which demonstrated hyperdynamic LV systolic function with an EF greater than 75%, no  regional wall motion abnormalities, severe LVH most prominent in the septum and apex, grade 2 diastolic dysfunction, mildly dilated ascending aorta, trace aortic insufficiency, chordal S.A.M. without significant LVOT gradient at rest or with Valsalva and mild left atrial enlargement.  Findings were consistent with hypertrophic cardiomyopathy.  EP deemed him low risk for sudden death and deferred ICD implantation.  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  He was last seen in the office in 07/2020 and was doing relatively well from a cardiac perspective.  He was trying to be more active.  He did note 1 episode of palpitations/lightheadedness while running without syncope.  Given symptoms, he underwent ETT on 08/09/2020 which demonstrated good exercise capacity with blunted heart rate response and no significant ST-T changes being observed.  Baseline EKG demonstrated sinus bradycardia with LVH and diffuse ST-T abnormalities consistent with LVH/HCM.  There were no significant arrhythmias.  This was a nondiagnostic exercise stress test for ischemia given inability to reach target heart rate and baseline ST-T changes.  Appropriate blood pressure response was noted during the stress in the setting of known hypertrophic cardiomyopathy.  He comes in doing very well today.  No angina, dyspnea or palpitations.  He has only noted 2 further episodes of mild dizziness with exertion without associated presyncope or syncope.  Tolerating all medications without issues.  Blood pressure typically runs in the 140s systolic at home.  He reports having had updated labs through his PCPs office approximately 3 weeks ago.  No falls, hematochezia, or melena.  He does not have any concerns at this time.   Labs independently reviewed: 02/2020 - Hgb 15.0, PLT 214 07/2019 - ALT normal, TC 131, TG 111, HDL  38, LDL 71 05/2019 - potassium 4.1, BUN 16, serum creatinine 1.33 03/2019 - A1c 5.6  Past Medical History:  Diagnosis Date   Erectile dysfunction    Gout    Hyperandrogenism    Hyperlipidemia    Hypertension    Hypertrophic cardiomyopathy (HCC)    Sleep apnea     Past Surgical History:  Procedure Laterality Date   CARDIAC CATHETERIZATION     CHOANAL ADENIODECTOMY     COLONOSCOPY WITH PROPOFOL N/A 05/13/2015   Procedure: COLONOSCOPY WITH PROPOFOL;  Surgeon: Christena Deem, MD;  Location: [facility] ENDOSCOPY;  Service: Endoscopy;  Laterality: N/A;   LEFT HEART CATH AND CORONARY ANGIOGRAPHY N/A 03/29/2019   Procedure: LEFT HEART CATH AND CORONARY ANGIOGRAPHY and possible PCI and stent;  Surgeon: Alwyn Pea, MD;  Location: [facility] INVASIVE CV LAB;  Service: Cardiovascular;  Laterality: N/A;   TONSILLECTOMY     TONSILLECTOMY AND ADENOIDECTOMY      Current Medications: Current Meds  Medication Sig   aspirin EC 81 MG tablet Take 81 mg by mouth daily.   atorvastatin (LIPITOR) 20 MG tablet TAKE 1 TABLET BY MOUTH EVERY DAY   colchicine 0.6 MG tablet Take 1 tablet by mouth daily as needed.    losartan (COZAAR) 100 MG tablet Take 100 mg by mouth daily.   metoprolol succinate (TOPROL-XL) 50 MG 24 hr tablet Take 1 tablet (50 mg total) by mouth daily.   predniSONE (STERAPRED UNI-PAK 21 TAB) 10 MG (21) TBPK tablet Take by mouth.   sildenafil (REVATIO) 20 MG tablet Take 20 mg by mouth as needed.   traMADol (ULTRAM) 50 MG tablet Take 50 mg by mouth every 6 (six) hours as needed.   [DISCONTINUED] amLODipine (NORVASC) 5 MG tablet Take 1 tablet (5 mg total) by mouth daily. PLEASE CALL TO SCHEDULE OFFICE VISIT FOR FURTHER REFILLS. THANK YOU!    Allergies:   Patient has no known allergies.   Social History   Socioeconomic History   Marital status: Married    Spouse name: Not on file   Number of children: Not on file   Years of education: Not on file   Highest education level: Not on file   Occupational History   Not on file  Tobacco Use   Smoking status: Never   Smokeless tobacco: Never  Vaping Use   Vaping Use: Never used  Substance and Sexual Activity   Alcohol use: No   Drug use: No   Sexual activity: Not on file  Other Topics Concern   Not on file  Social History Narrative   Not on file   Social Determinants of Health   Financial Resource Strain: Not on file  Food Insecurity: Not on file  Transportation Needs: Not on file  Physical Activity: Not on file  Stress: Not on file  Social Connections: Not on file     Family History:  The patient's family history includes Heart attack (age of onset: 18) in his nephew; Heart attack (age of onset: 68) in his father. There is no history of Heart failure or Sudden Cardiac Death.  ROS:   Full 12-point review of systems is negative unless otherwise noted in the HPI.   EKGs/Labs/Other Studies Reviewed:    Studies reviewed were summarized above. The additional studies were reviewed today:  ETT 08/09/2020: Baseline  EKG demonstrates sinus bradycardia with LVH and diffuse ST/T abnormalities consistent with LVH/HCM. The patient demonstrates good exercise capacity with blunted heart rate response. Target heart rate was not achieved. The test was stopped due to shortness of breath. Blood pressure response was normal. No significant change in baseline ST/T abnormalities was observed. There were no significant arrhythmias.   Non-diagnostic exercise tolerance test for ischemia, given inability to reach target heart rate and baseline ST/T abnormalities.  Appropriate blood pressure response noted to stress in the setting of known hypertrophic cardiomyopathy. __________  2D echo 09/05/2019: 1. Definity used; hyperdynamic LV systolic function; severe LVH (most  prominent in the septum and apex); grade 2 diastolic dysfunction; mildly  dilated ascending aorta; trace AI; chordal SAM but no significant LVOT  gradient at rest or  with valsalva; mild   LAE; findings suggest HCM.   2. Left ventricular ejection fraction, by estimation, is >75%. The left  ventricle has hyperdynamic function. The left ventricle has no regional  wall motion abnormalities. There is severe left ventricular hypertrophy.  Left ventricular diastolic  parameters are consistent with Grade II diastolic dysfunction  (pseudonormalization). Elevated left atrial pressure.   3. Right ventricular systolic function is normal. The right ventricular  size is normal.   4. Left atrial size was mildly dilated.   5. The mitral valve is normal in structure. Trivial mitral valve  regurgitation. No evidence of mitral stenosis.   6. The aortic valve is tricuspid. Aortic valve regurgitation is trivial.  No aortic stenosis is present.   7. Aortic dilatation noted. There is mild dilatation of the ascending  aorta measuring 41 mm.   8. The inferior vena cava is normal in size with greater than 50%  respiratory variability, suggesting right atrial pressure of 3 mmHg. __________  Cardiac MRI 06/2019: IMPRESSION: 1. Asymmetric LV hypertrophy measuring up to 20mm in basal anteroseptum (10mm in posterior wall), consistent with hypertrophic cardiomyopathy. There is also significant apical hypertrophy, measuring up to 18mm in apical inferior wall 2. LVOT flow acceleration with peak resting gradient measured at 16 mmHg 3. Patchy late gadolinium enhancement in septum and apex, consistent with HCM. LGE accounts for 4% of total myocardial mass 4. Normal LV size and systolic function (EF 68%) 5. Normal RV size and systolic function (EF 79%) __________  Luci Bank patch 06/2019: The patient was monitored for 13 days, 19 hours. The predominant rhythm was sinus with an average rate of 69 bpm (range 50 to 121 bpm in sinus). There were occasional PACs and rare PVCs. Eight atrial runs lasting up to 12 beats with a maximum rate of 154 bpm were observed. There is a single episode  of nonsustained ventricular tachycardia lasting 7 beats with a maximal rate of 146 bpm. There was no sustained arrhythmia or prolonged pause. Patient triggered events correspond to sinus rhythm with PACs and PVCs.   Predominantly sinus rhythm with occasional PACs and rare PVCs.  Single episode of NSVT lasting 7 beats was observed, as well as brief atrial runs lasting up to 12 beats. __________  2D echo 05/12/2019: 1. Left ventricular ejection fraction, by visual estimation, is 60 to  65%. The left ventricle has normal function. There is moderate to severely  increased left ventricular hypertrophy (ivs 2 cm, posterior wall 1.5 cm).   2. Left ventricular diastolic parameters are consistent with Grade I  diastolic dysfunction (impaired relaxation).   3. The left ventricle has no regional wall motion abnormalities. Near  cavity obliteration in systole. LVOT  gradient of 15 mm Hg. No valsalva  measurements available.   4. Global right ventricle has normal systolic function.The right  ventricular size is normal. No increase in right ventricular wall  thickness.   5. Left atrial size was moderately dilated.   6. There is mild dilatation of the aortic root.   7. TR signal is inadequate for assessing pulmonary artery systolic  pressure. __________  [facility] 03/29/2019 [facility] Cardiology): Normal overall left ventricular function ejection fraction of 60% Left dominant system No significant obstructive coronary disease just minor irregularities   Conclusion Benign cardiac cath Normal left ventricular function Ejection fraction of 60% Successful right radial left heart cardiac cath    EKG:  EKG is ordered today.  The EKG ordered today demonstrates NSR, 62 bpm, left atrial enlargement, LVH with early repolarization abnormalities consistent with hypertrophic cardiomyopathy, overall no significant change when compared to prior tracing  Recent Labs: 03/12/2021: BUN 21; Creatinine, Ser 1.31;  Potassium 4.1; Sodium 137  Recent Lipid Panel    Component Value Date/Time   CHOL 131 07/25/2019 0717   TRIG 111 07/25/2019 0717   HDL 38 (L) 07/25/2019 0717   CHOLHDL 3.4 07/25/2019 0717   VLDL 22 07/25/2019 0717   LDLCALC 71 07/25/2019 0717    PHYSICAL EXAM:    VS:  BP 140/72 (BP Location: Left Arm, Patient Position: Sitting, Cuff Size: Normal)   Pulse 62   Ht 5\' 8"  (1.727 m)   Wt 210 lb (95.3 kg)   BMI 31.93 kg/m   BMI: Body mass index is 31.93 kg/m.  Physical Exam Constitutional:      Appearance: He is well-developed.  HENT:     Head: Normocephalic and atraumatic.  Eyes:     General:        Right eye: No discharge.        Left eye: No discharge.  Neck:     Vascular: No JVD.  Cardiovascular:     Rate and Rhythm: Normal rate and regular rhythm.     Pulses:          Posterior tibial pulses are 2+ on the right side and 2+ on the left side.     Heart sounds: S1 normal and S2 normal. Heart sounds not distant. No midsystolic click and no opening snap. Murmur heard.  Systolic murmur is present with a grade of 1/6.    No friction rub.  Pulmonary:     Effort: Pulmonary effort is normal. No respiratory distress.     Breath sounds: Normal breath sounds. No decreased breath sounds, wheezing or rales.  Chest:     Chest wall: No tenderness.  Abdominal:     General: There is no distension.     Palpations: Abdomen is soft.     Tenderness: There is no abdominal tenderness.  Musculoskeletal:     Cervical back: Normal range of motion.     Right lower leg: No edema.     Left lower leg: No edema.  Skin:    General: Skin is warm and dry.     Nails: There is no clubbing.  Neurological:     Mental Status: He is alert and oriented to person, place, and time.  Psychiatric:        Speech: Speech normal.        Behavior: Behavior normal.        Thought Content: Thought content normal.        Judgment: Judgment normal.    Wt Readings from  Last 3 Encounters:  03/12/21 210 lb  (95.3 kg)  03/09/21 205 lb (93 kg)  07/31/20 207 lb (93.9 kg)     ASSESSMENT & PLAN:   Hypertrophic cardiomyopathy: He is doing well and is largely asymptomatic.  He has only noted 2 further episodes of mild dizziness with exertion since he was last seen.  With this, he underwent GXT which showed no significant arrhythmias or drops in blood pressure.  Prior outpatient cardiac monitoring noted a 7 beat run of NSVT.  He has been evaluated by EP with no recommendation for ICD.  Recommend he follow-up with Dr. Jomarie Longs to discuss results and recommendations regarding his genetic testing.  We will have our staff reach out regarding this.  Continue Toprol-XL.  HTN: Blood pressure is mildly elevated in the office today with home readings typically in the 140s systolic.  Titrate amlodipine to 10 mg daily.  He will otherwise continue losartan 100 mg and Toprol-XL 50 mg daily.  Heart rate precludes further escalation of beta-blocker at this time.  Low-sodium diet is encouraged.  Updated labs, obtained approximately 3 weeks ago per patient, will be requested from his PCP.  Dilated ascending aorta: Schedule CTA of the aorta to establish baseline.  Optimize blood pressure control as outlined above.   Disposition: F/u with Dr. Okey Dupre or an APP in 6 months, and EP as directed.   Medication Adjustments/Labs and Tests Ordered: Current medicines are reviewed at length with the patient today.  Concerns regarding medicines are outlined above. Medication changes, Labs and Tests ordered today are summarized above and listed in the Patient Instructions accessible in Encounters.   Signed, Eula Listen, PA-C 03/12/2021 9:36 AM     [facility] [place] [address] [place], [place] [address] [phone]

## 2021-03-09 ENCOUNTER — Emergency Department: Payer: BC Managed Care – PPO

## 2021-03-09 ENCOUNTER — Other Ambulatory Visit: Payer: Self-pay

## 2021-03-09 ENCOUNTER — Emergency Department
Admission: EM | Admit: 2021-03-09 | Discharge: 2021-03-09 | Disposition: A | Discharge status: Home / Self Care | Payer: BC Managed Care – PPO | Attending: Emergency Medicine | Admitting: Emergency Medicine

## 2021-03-09 DIAGNOSIS — M79605 Pain in left leg: Secondary | ICD-10-CM | POA: Insufficient documentation

## 2021-03-09 DIAGNOSIS — Z5321 Procedure and treatment not carried out due to patient leaving prior to being seen by health care provider: Secondary | ICD-10-CM | POA: Insufficient documentation

## 2021-03-09 NOTE — ED Triage Notes (Signed)
Pt presents via POC c/o posterior upper leg pain x5 days, Denies specific injury other than walking. Reporting throbbing pain and pain with ambulating.

## 2021-03-12 ENCOUNTER — Ambulatory Visit: Payer: BC Managed Care – PPO | Admitting: Physician Assistant

## 2021-03-12 ENCOUNTER — Telehealth: Payer: Self-pay | Admitting: Physician Assistant

## 2021-03-12 ENCOUNTER — Encounter: Payer: Self-pay | Admitting: Physician Assistant

## 2021-03-12 ENCOUNTER — Other Ambulatory Visit
Admission: RE | Admit: 2021-03-12 | Discharge: 2021-03-12 | Disposition: A | Discharge status: Home / Self Care | Payer: BC Managed Care – PPO | Attending: Physician Assistant | Admitting: Physician Assistant

## 2021-03-12 VITALS — BP 140/72 | HR 62 | Ht 68.0 in | Wt 210.0 lb

## 2021-03-12 DIAGNOSIS — I10 Essential (primary) hypertension: Secondary | ICD-10-CM

## 2021-03-12 DIAGNOSIS — I422 Other hypertrophic cardiomyopathy: Secondary | ICD-10-CM

## 2021-03-12 DIAGNOSIS — I77810 Thoracic aortic ectasia: Secondary | ICD-10-CM | POA: Insufficient documentation

## 2021-03-12 LAB — BASIC METABOLIC PANEL
Anion gap: 4 — ABNORMAL LOW (ref 5–15)
BUN: 21 mg/dL — ABNORMAL HIGH (ref 6–20)
CO2: 25 mmol/L (ref 22–32)
Calcium: 9.5 mg/dL (ref 8.9–10.3)
Chloride: 108 mmol/L (ref 98–111)
Creatinine, Ser: 1.31 mg/dL — ABNORMAL HIGH (ref 0.61–1.24)
GFR, Estimated: >60 mL/min (ref >60)
Glucose, Bld: 98 mg/dL (ref 70–99)
Potassium: 4.1 mmol/L (ref 3.5–5.1)
Sodium: 137 mmol/L (ref 135–145)

## 2021-03-12 MED ORDER — AMLODIPINE BESYLATE 10 MG PO TABS: 10.0000 mg | ORAL_TABLET | Freq: Every day | ORAL | 1 refills | Status: DC

## 2021-03-12 NOTE — Patient Instructions (Signed)
Medication Instructions:  Your physician has recommended you make the following change in your medication:   INCREASE Amlodipine to 10 mg daily. An Rx has been sent to your pharmacy.   *If you need a refill on your cardiac medications before your next appointment, please call your pharmacy*   Lab Work: Your physician recommends that you return for lab work  (bmp) : Prior to the CTA  We will request your previous labs from your primary care provider  If you have labs (blood work) drawn today and your tests are completely normal, you will receive your results only by: MyChart Message (if you have MyChart) OR A paper copy in the mail If you have any lab test that is abnormal or we need to change your treatment, we will call you to review the results.   Testing/Procedures: Non-Cardiac CT Angiography (CTA) of the chest, is a special type of CT scan that uses a computer to produce multi-dimensional views of major blood vessels throughout the body. In CT angiography, a contrast material is injected through an IV to help visualize the blood vessels  Please call [phone] to schedule Test is to be performed at [facility] [address] [place], [place]    Follow-Up: At [facility], you and your health needs are our priority.  As part of our continuing mission to provide you with exceptional heart care, we have created designated Provider Care Teams.  These Care Teams include your primary Cardiologist (physician) and Advanced Practice Providers (APPs -  Physician Assistants and Nurse Practitioners) who all work together to provide you with the care you need, when you need it.  We recommend signing up for the patient portal called "MyChart".  Sign up information is provided on this After Visit Summary.  MyChart is used to connect with patients for Virtual Visits (Telemedicine).  Patients are able to view lab/test results, encounter notes, upcoming  appointments, etc.  Non-urgent messages can be sent to your provider as well.   To learn more about what you can do with MyChart, go to [email or web address].    Your next appointment:   6 month(s)  The format for your next appointment:   In Person  Provider:   You may see Yvonne Kendall, MD or one of the following Advanced Practice Providers on your designated Care Team:   Nicolasa Ducking, NP Eula Listen, PA-C Cadence Fransico Michael, PA-C:1}    Other Instructions N/A

## 2021-03-12 NOTE — Telephone Encounter (Addendum)
Called the patients pcp. Requested copies of the pt most recent lab results be faxed to our office attn Eula Listen, PA.

## 2021-03-17 ENCOUNTER — Telehealth: Payer: Self-pay | Admitting: *Deleted

## 2021-03-17 NOTE — Telephone Encounter (Signed)
Left voicemail message to call back for review of results.  

## 2021-03-17 NOTE — Telephone Encounter (Signed)
-----   Message from Sondra Barges, [place] sent at 03/12/2021  9:58 AM EST ----- Labs obtained for CTA of the aorta show stable mild renal dysfunction, and a potassium at goal.

## 2021-03-18 NOTE — Telephone Encounter (Signed)
Reviewed results with patient. He states that he has not received a call to schedule his test. Reviewed that on his paperwork they had number listed for him to call and schedule. He did not realize this and provided him with number to call as well. He stated that he would call here shortly to get that scheduled. He verbalized understanding of results with no further questions at this time.

## 2021-03-25 ENCOUNTER — Other Ambulatory Visit: Payer: Self-pay | Admitting: Internal Medicine

## 2021-03-25 DIAGNOSIS — I422 Other hypertrophic cardiomyopathy: Secondary | ICD-10-CM

## 2021-03-28 ENCOUNTER — Ambulatory Visit
Admission: RE | Admit: 2021-03-28 | Discharge: 2021-03-28 | Disposition: A | Discharge status: Home / Self Care | Payer: BC Managed Care – PPO | Source: Ambulatory Visit | Attending: Physician Assistant | Admitting: Physician Assistant

## 2021-03-28 ENCOUNTER — Other Ambulatory Visit: Payer: Self-pay

## 2021-03-28 DIAGNOSIS — I77810 Thoracic aortic ectasia: Secondary | ICD-10-CM | POA: Diagnosis not present

## 2021-03-28 MED ORDER — IOHEXOL 350 MG/ML SOLN
75.0000 mL | Freq: Once | INTRAVENOUS | Status: AC | PRN
  Administered 2021-03-28: 75 mL via INTRAVENOUS

## 2021-03-31 ENCOUNTER — Other Ambulatory Visit: Payer: Self-pay | Admitting: Internal Medicine

## 2021-04-01 ENCOUNTER — Other Ambulatory Visit: Payer: Self-pay | Admitting: Internal Medicine

## 2021-04-18 ENCOUNTER — Other Ambulatory Visit: Payer: Self-pay | Admitting: Internal Medicine

## 2021-04-18 DIAGNOSIS — I422 Other hypertrophic cardiomyopathy: Secondary | ICD-10-CM

## 2021-05-06 IMAGING — CR DG CHEST 2V
2 series · 2 of 2 positions shown · non-contrast
Comparison: None.

CLINICAL DATA: Chest pain.

EXAM:
CHEST - 2 VIEW

[chest pa]
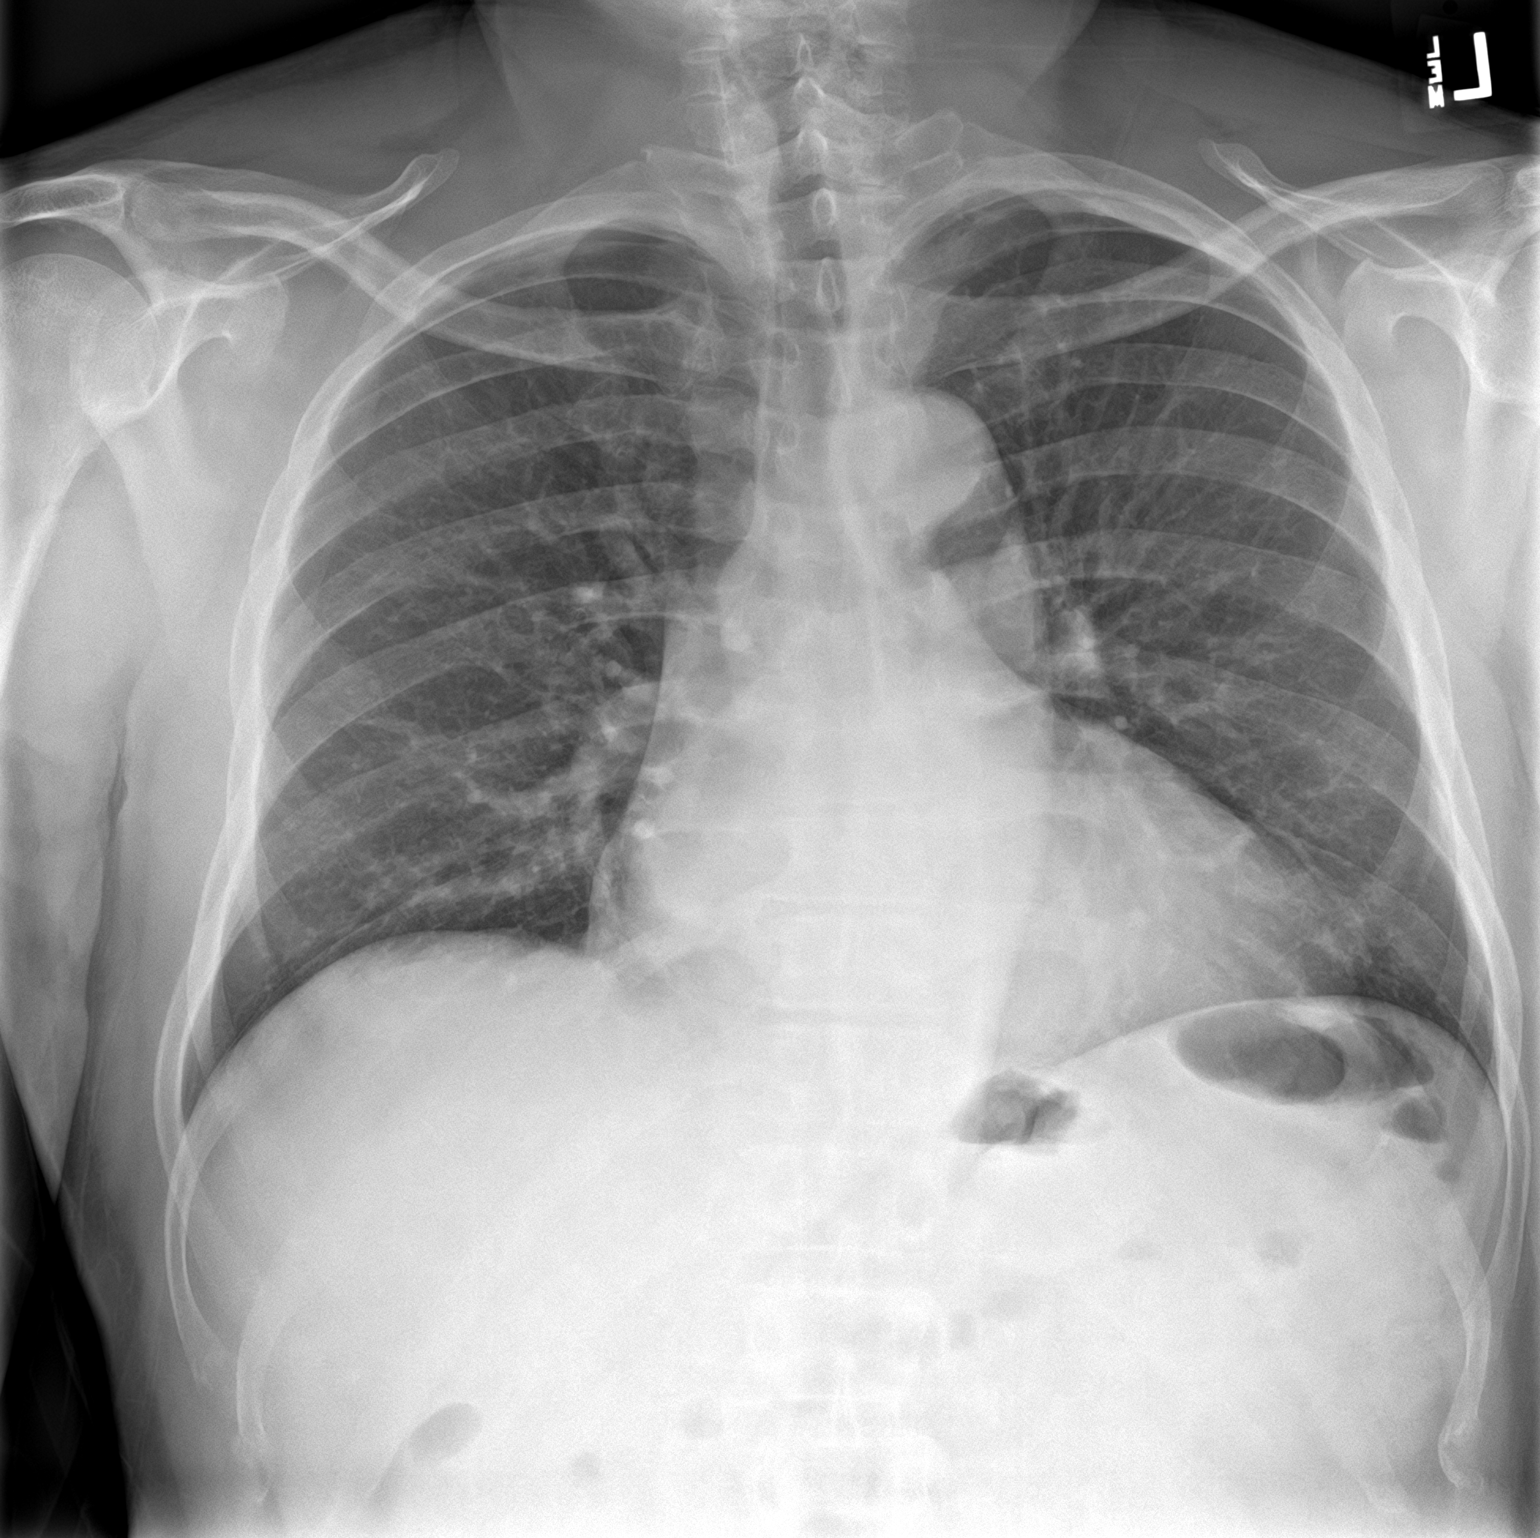

[chest lat]
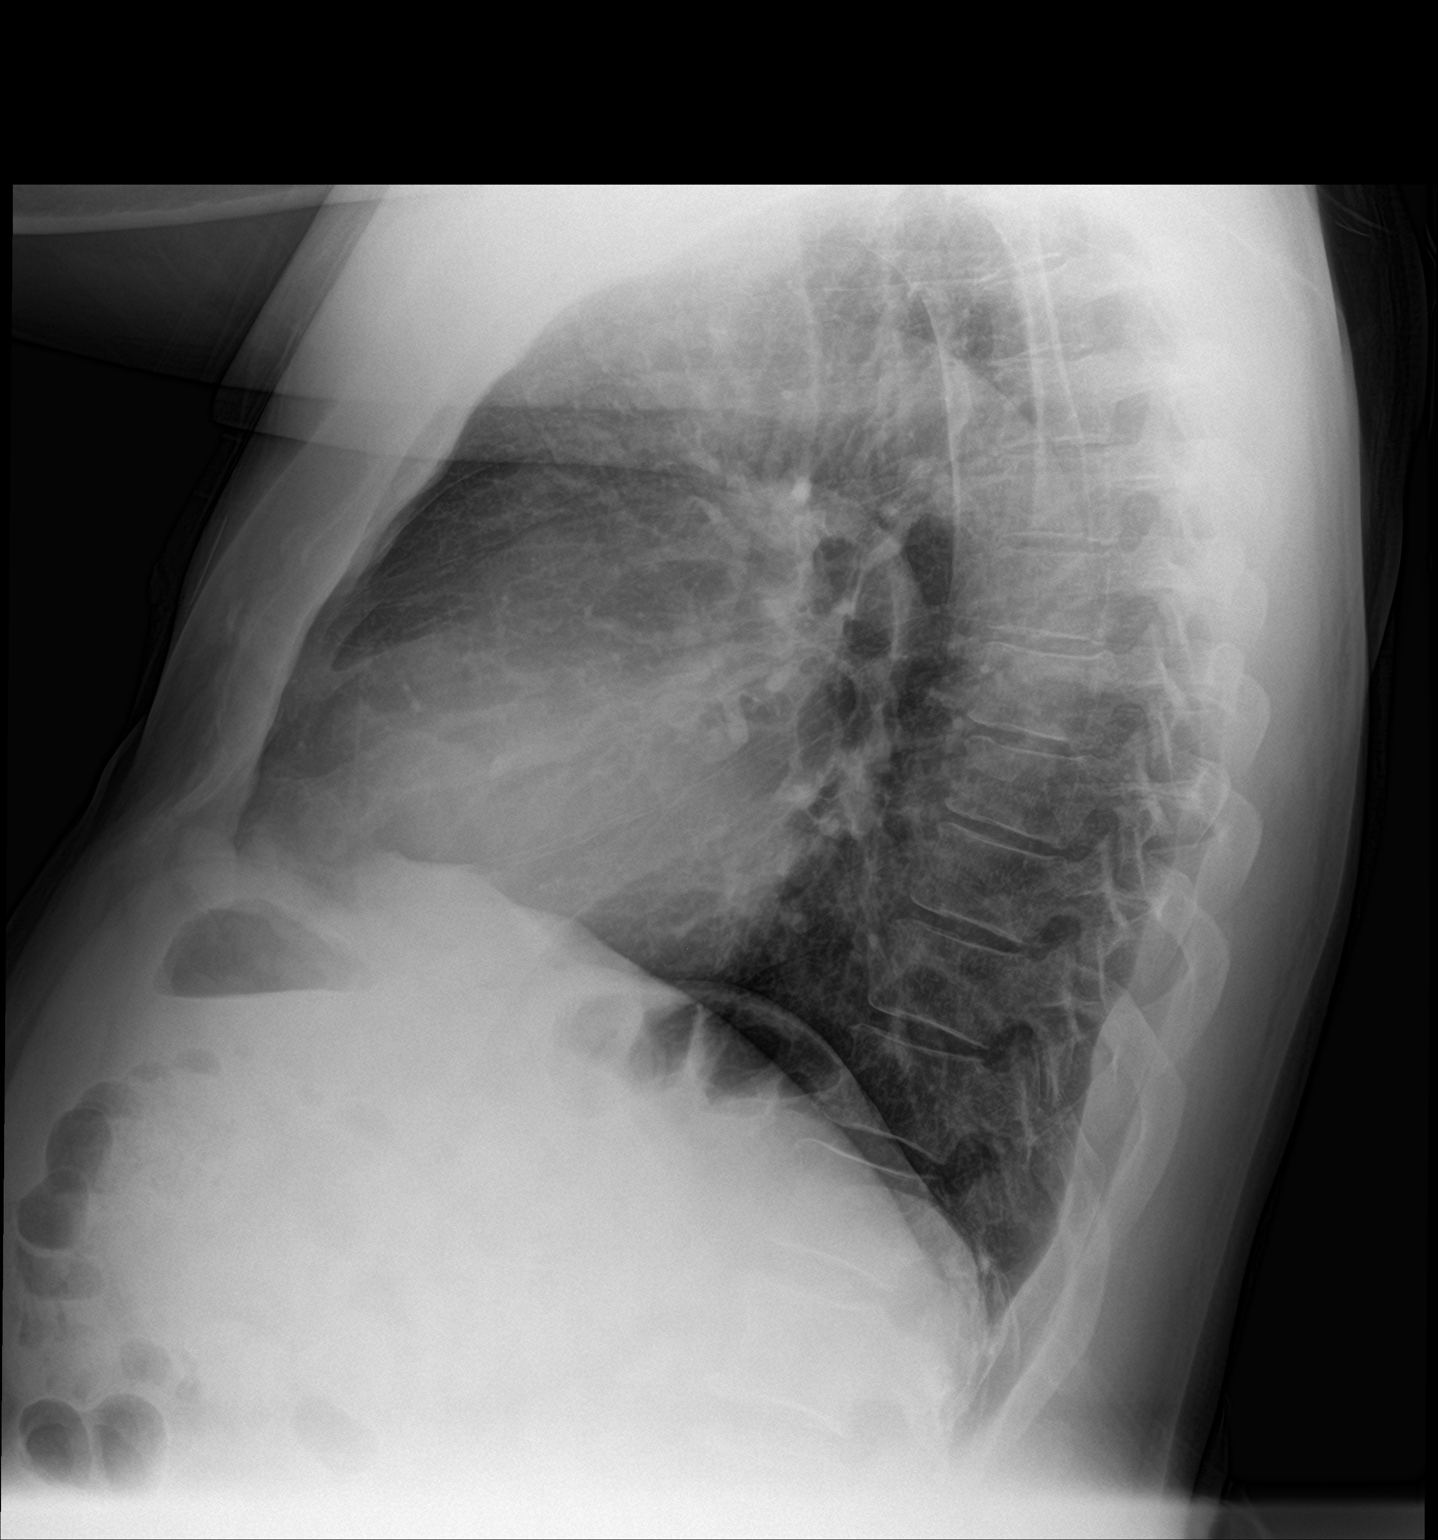

[2 of 2 positions shown; findings below may reference images not displayed]

FINDINGS: The heart size and mediastinal contours are within normal limits.
Both lungs are clear. No pneumothorax or pleural effusion is noted.
The visualized skeletal structures are unremarkable.
IMPRESSION: No active cardiopulmonary disease.

## 2021-05-23 ENCOUNTER — Encounter: Payer: Self-pay | Admitting: *Deleted

## 2021-05-26 ENCOUNTER — Ambulatory Visit
Admission: RE | Admit: 2021-05-26 | Discharge: 2021-05-26 | Disposition: A | Discharge status: Home / Self Care | Payer: BC Managed Care – PPO | Source: Ambulatory Visit | Attending: Gastroenterology | Admitting: Gastroenterology

## 2021-05-26 ENCOUNTER — Ambulatory Visit: Payer: BC Managed Care – PPO | Admitting: Certified Registered Nurse Anesthetist

## 2021-05-26 ENCOUNTER — Encounter
Admission: RE | Disposition: A | Discharge status: Home / Self Care | Payer: Self-pay | Source: Ambulatory Visit | Attending: Gastroenterology

## 2021-05-26 ENCOUNTER — Encounter: Payer: Self-pay | Admitting: *Deleted

## 2021-05-26 ENCOUNTER — Other Ambulatory Visit: Payer: Self-pay

## 2021-05-26 DIAGNOSIS — M109 Gout, unspecified: Secondary | ICD-10-CM | POA: Diagnosis not present

## 2021-05-26 DIAGNOSIS — I10 Essential (primary) hypertension: Secondary | ICD-10-CM | POA: Insufficient documentation

## 2021-05-26 DIAGNOSIS — I422 Other hypertrophic cardiomyopathy: Secondary | ICD-10-CM | POA: Insufficient documentation

## 2021-05-26 DIAGNOSIS — N529 Male erectile dysfunction, unspecified: Secondary | ICD-10-CM | POA: Insufficient documentation

## 2021-05-26 DIAGNOSIS — G4730 Sleep apnea, unspecified: Secondary | ICD-10-CM | POA: Insufficient documentation

## 2021-05-26 DIAGNOSIS — Z800 Family history of malignant neoplasm of digestive organs: Secondary | ICD-10-CM | POA: Diagnosis not present

## 2021-05-26 DIAGNOSIS — K640 First degree hemorrhoids: Secondary | ICD-10-CM | POA: Diagnosis not present

## 2021-05-26 DIAGNOSIS — Z79899 Other long term (current) drug therapy: Secondary | ICD-10-CM | POA: Insufficient documentation

## 2021-05-26 DIAGNOSIS — E785 Hyperlipidemia, unspecified: Secondary | ICD-10-CM | POA: Insufficient documentation

## 2021-05-26 DIAGNOSIS — Z1211 Encounter for screening for malignant neoplasm of colon: Secondary | ICD-10-CM | POA: Diagnosis present

## 2021-05-26 HISTORY — PX: COLONOSCOPY: SHX5424

## 2021-05-26 SURGERY — COLONOSCOPY
Anesthesia: General

## 2021-05-26 MED ORDER — SODIUM CHLORIDE 0.9 % IV SOLN: INTRAVENOUS | Status: DC

## 2021-05-26 MED ORDER — LIDOCAINE HCL (CARDIAC) PF 100 MG/5ML IV SOSY
PREFILLED_SYRINGE | INTRAVENOUS | Status: DC | PRN
  Administered 2021-05-26: 50 mg via INTRAVENOUS

## 2021-05-26 MED ORDER — PROPOFOL 500 MG/50ML IV EMUL
INTRAVENOUS | Status: DC | PRN
  Administered 2021-05-26: 160 ug/kg/min via INTRAVENOUS

## 2021-05-26 MED ORDER — PROPOFOL 10 MG/ML IV BOLUS
INTRAVENOUS | Status: DC | PRN
  Administered 2021-05-26: 80 mg via INTRAVENOUS

## 2021-05-26 NOTE — Anesthesia Postprocedure Evaluation (Signed)
Anesthesia Post Note  Patient: Brady Johnson  Procedure(s) Performed: COLONOSCOPY  Patient location during evaluation: Endoscopy Anesthesia Type: General Level of consciousness: awake and alert Pain management: pain level controlled Vital Signs Assessment: post-procedure vital signs reviewed and stable Respiratory status: spontaneous breathing, nonlabored ventilation, respiratory function stable and patient connected to nasal cannula oxygen Cardiovascular status: blood pressure returned to baseline and stable Postop Assessment: no apparent nausea or vomiting Anesthetic complications: no   No notable events documented.   Last Vitals:  Vitals:   05/26/21 1429 05/26/21 1439  BP: 112/72 125/85  Pulse: 60 (!) 58  Resp: 19 (!) 21  Temp:    SpO2: 98% 100%    Last Pain:  Vitals:   05/26/21 1439  TempSrc:   PainSc: 0-No pain                 Corinda Gubler

## 2021-05-26 NOTE — Anesthesia Preprocedure Evaluation (Signed)
Anesthesia Evaluation  Patient identified by MRN, date of birth, ID band Patient awake    Reviewed: Allergy & Precautions, NPO status , Patient's Chart, lab work & pertinent test results  History of Anesthesia Complications Negative for: history of anesthetic complications  Airway Mallampati: II  TM Distance: >3 FB Neck ROM: Full    Dental no notable dental hx. (+) Teeth Intact   Pulmonary sleep apnea , neg COPD, Patient abstained from smoking.Not current smoker,    Pulmonary exam normal breath sounds clear to auscultation       Cardiovascular Exercise Tolerance: Good METShypertension, Pt. on medications (-) CAD and (-) Past MI (-) dysrhythmias  Rhythm:Regular Rate:Normal - Systolic murmurs Hx hypertrophic cardiomyopathy  TTE (09/05/2019): Normal LV size with severe LVH.  LVEF greater than 75% with grade 2 diastolic dysfunction.  Left atrial pressure elevated.  Normal RV size and function.  Mild left atrial enlargement.  No significant valvular abnormality.  Mild dilation of ascending aorta noted, measuring 4.1 cm.   Neuro/Psych negative neurological ROS  negative psych ROS   GI/Hepatic neg GERD  ,(+)     (-) substance abuse  ,   Endo/Other  neg diabetes  Renal/GU negative Renal ROS     Musculoskeletal   Abdominal   Peds  Hematology   Anesthesia Other Findings Past Medical History: No date: Erectile dysfunction No date: Gout No date: Hyperandrogenism No date: Hyperlipidemia No date: Hypertension No date: Hypertrophic cardiomyopathy ([place]) No date: Sleep apnea  Reproductive/Obstetrics                             Anesthesia Physical Anesthesia Plan  ASA: 3  Anesthesia Plan: General   Post-op Pain Management: Minimal or no pain anticipated   Induction: Intravenous  PONV Risk Score and Plan: 2 and Propofol infusion, TIVA and Ondansetron  Airway Management Planned: Nasal  Cannula  Additional Equipment: None  Intra-op Plan:   Post-operative Plan:   Informed Consent: I have reviewed the patients History and Physical, chart, labs and discussed the procedure including the risks, benefits and alternatives for the proposed anesthesia with the patient or authorized representative who has indicated his/her understanding and acceptance.     Dental advisory given  Plan Discussed with: CRNA and Surgeon  Anesthesia Plan Comments: (Discussed risks of anesthesia with patient, including possibility of difficulty with spontaneous ventilation under anesthesia necessitating airway intervention, PONV, and rare risks such as cardiac or respiratory or neurological events, and allergic reactions. Discussed the role of CRNA in patient's perioperative care. Patient understands.)       Anesthesia Quick Evaluation

## 2021-05-26 NOTE — Transfer of Care (Signed)
Immediate Anesthesia Transfer of Care Note  Patient: Brady Johnson  Procedure(s) Performed: COLONOSCOPY  Patient Location: PACU  Anesthesia Type:General  Level of Consciousness: sedated  Airway & Oxygen Therapy: Patient Spontanous Breathing  Post-op Assessment: Report given to RN and Post -op Vital signs reviewed and stable  Post vital signs: Reviewed and stable  Last Vitals:  Vitals Value Taken Time  BP    Temp    Pulse 62 05/26/21 1420  Resp 19 05/26/21 1420  SpO2 93 % 05/26/21 1420    Last Pain:  Vitals:   05/26/21 1243  TempSrc: Temporal  PainSc: 0-No pain         Complications: No notable events documented.

## 2021-05-26 NOTE — Interval H&P Note (Signed)
History and Physical Interval Note:  05/26/2021 1:49 PM  Brady Johnson  has presented today for surgery, with the diagnosis of family HX of colon polyp.  The various methods of treatment have been discussed with the patient and family. After consideration of risks, benefits and other options for treatment, the patient has consented to  Procedure(s): COLONOSCOPY (N/A) as a surgical intervention.  The patient's history has been reviewed, patient examined, no change in status, stable for surgery.  I have reviewed the patient's chart and labs.  Questions were answered to the patient's satisfaction.     Regis Bill  Ok to proceed with colonoscopy

## 2021-05-26 NOTE — Op Note (Signed)
[facility] Gastroenterology Patient Name: Brady Johnson Procedure Date: 05/26/2021 1:47 PM MRN: [id number] Account #: [id number] Date of Birth: May 29, 1960 Admit Type: Outpatient Age: 61 Room: [facility] ENDO ROOM 3 Gender: Male Note Status: Finalized Instrument Name: Park Meo [id number] Procedure:             Colonoscopy Indications:           Screening in patient at increased risk: Family history                         of 1st-degree relative with colorectal cancer before                         age 34 years Providers:             Andrey Farmer MD, MD Medicines:             Monitored Anesthesia Care Complications:         No immediate complications. Procedure:             Pre-Anesthesia Assessment:                        - Prior to the procedure, a History and Physical was                         performed, and patient medications and allergies were                         reviewed. The patient is competent. The risks and                         benefits of the procedure and the sedation options and                         risks were discussed with the patient. All questions                         were answered and informed consent was obtained.                         Patient identification and proposed procedure were                         verified by the physician, the nurse, the                         anesthesiologist, the anesthetist and the technician                         in the endoscopy suite. Mental Status Examination:                         alert and oriented. Airway Examination: normal                         oropharyngeal airway and neck mobility. Respiratory                         Examination: clear to auscultation. CV Examination:  normal. Prophylactic Antibiotics: The patient does not                         require prophylactic antibiotics. Prior                         Anticoagulants: The patient has taken no  previous                         anticoagulant or antiplatelet agents. ASA Grade                         Assessment: II - A patient with mild systemic disease.                         After reviewing the risks and benefits, the patient                         was deemed in satisfactory condition to undergo the                         procedure. The anesthesia plan was to use monitored                         anesthesia care (MAC). Immediately prior to                         administration of medications, the patient was                         re-assessed for adequacy to receive sedatives. The                         heart rate, respiratory rate, oxygen saturations,                         blood pressure, adequacy of pulmonary ventilation, and                         response to care were monitored throughout the                         procedure. The physical status of the patient was                         re-assessed after the procedure.                        After obtaining informed consent, the colonoscope was                         passed under direct vision. Throughout the procedure,                         the patient's blood pressure, pulse, and oxygen                         saturations were monitored continuously. The  Colonoscope was introduced through the anus and                         advanced to the the cecum, identified by appendiceal                         orifice and ileocecal valve. The colonoscopy was                         performed without difficulty. The patient tolerated                         the procedure well. The quality of the bowel                         preparation was good. Findings:      The perianal and digital rectal examinations were normal.      Internal hemorrhoids were found during retroflexion. The hemorrhoids       were Grade I (internal hemorrhoids that do not prolapse).      The exam was otherwise without  abnormality on direct and retroflexion       views. Impression:            - Internal hemorrhoids.                        - The examination was otherwise normal on direct and                         retroflexion views.                        - No specimens collected. Recommendation:        - Discharge patient to home.                        - Resume previous diet.                        - Continue present medications.                        - Repeat colonoscopy in 5 years for screening purposes.                        - Return to referring physician as previously                         scheduled. Procedure Code(s):     --- Professional ---                        W1191, Colorectal cancer screening; colonoscopy on                         individual at high risk Diagnosis Code(s):     --- Professional ---                        Z80.0, Family history of malignant neoplasm of  digestive organs                        K64.0, First degree hemorrhoids CPT copyright 2019 American Medical Association. All rights reserved. The codes documented in this report are preliminary and upon coder review may  be revised to meet current compliance requirements. Andrey Farmer MD, MD 05/26/2021 2:22:38 PM Number of Addenda: 0 Note Initiated On: 05/26/2021 1:47 PM Scope Withdrawal Time: 0 hours 9 minutes 36 seconds  Total Procedure Duration: 0 hours 16 minutes 23 seconds  Estimated Blood Loss:  Estimated blood loss: none.      [facility]

## 2021-05-26 NOTE — Anesthesia Procedure Notes (Signed)
Date/Time: 05/26/2021 1:52 PM Performed by: Ginger Carne, CRNA Pre-anesthesia Checklist: Patient identified, Emergency Drugs available, Suction available, Patient being monitored and Timeout performed Patient Re-evaluated:Patient Re-evaluated prior to induction Oxygen Delivery Method: Nasal cannula Preoxygenation: Pre-oxygenation with 100% oxygen Induction Type: IV induction

## 2021-05-26 NOTE — H&P (Signed)
Outpatient short stay form Pre-procedure 05/26/2021  Lesly Rubenstein, MD  Primary Physician: [facility]  Reason for visit:  Screening  History of present illness:    61 y/o gentleman with history of hypertensions and HLD here for screening colonoscopy. Last colonoscopy was in 2017 and was overall unremarkable. Brother diagnosed with colon cancer at the age of 66. No abdominal surgeries and no blood thinners.    Current Facility-Administered Medications:    0.9 %  sodium chloride infusion, , Intravenous, Continuous, Ysabela Keisler, Hilton Cork, MD, Last Rate: 20 mL/hr at 05/26/21 1306, New Bag at 05/26/21 1306  Medications Prior to Admission  Medication Sig Dispense Refill Last Dose   aspirin EC 81 MG tablet Take 81 mg by mouth daily.   Past Week   baclofen (LIORESAL) 10 MG tablet Take 10 mg by mouth 3 (three) times daily.   05/25/2021   colchicine 0.6 MG tablet Take 1 tablet by mouth daily as needed.    05/25/2021   losartan (COZAAR) 100 MG tablet Take 100 mg by mouth daily.   05/25/2021   metoprolol succinate (TOPROL-XL) 50 MG 24 hr tablet TAKE 1 TABLET BY MOUTH EVERY DAY 30 tablet 4 05/25/2021   traMADol (ULTRAM) 50 MG tablet Take 50 mg by mouth every 6 (six) hours as needed.   Past Week   amLODipine (NORVASC) 10 MG tablet Take 1 tablet (10 mg total) by mouth daily. (Patient not taking: Reported on 05/23/2021) 90 tablet 1 Completed Course   atorvastatin (LIPITOR) 20 MG tablet TAKE 1 TABLET BY MOUTH EVERY DAY (Patient not taking: Reported on 05/23/2021) 90 tablet 0 Completed Course   predniSONE (STERAPRED UNI-PAK 21 TAB) 10 MG (21) TBPK tablet Take by mouth. (Patient not taking: Reported on 05/23/2021)   Completed Course   sildenafil (REVATIO) 20 MG tablet Take 20 mg by mouth as needed.        No Known Allergies   Past Medical History:  Diagnosis Date   Erectile dysfunction    Gout    Hyperandrogenism    Hyperlipidemia    Hypertension    Hypertrophic  cardiomyopathy ([place])    Sleep apnea     Review of systems:  Otherwise negative.    Physical Exam  Gen: Alert, oriented. Appears stated age.  HEENT:  PERRLA. Lungs: No respiratory distress CV: RRR Abd: soft, benign, no masses Ext: No edema    Planned procedures: Proceed with colonoscopy. The patient understands the nature of the planned procedure, indications, risks, alternatives and potential complications including but not limited to bleeding, infection, perforation, damage to internal organs and possible oversedation/side effects from anesthesia. The patient agrees and gives consent to proceed.  Please refer to procedure notes for findings, recommendations and patient disposition/instructions.     Lesly Rubenstein, MD [facility] Gastroenterology

## 2021-05-27 ENCOUNTER — Encounter: Payer: Self-pay | Admitting: Gastroenterology

## 2021-06-24 ENCOUNTER — Other Ambulatory Visit: Payer: Self-pay

## 2021-06-24 ENCOUNTER — Ambulatory Visit: Payer: BC Managed Care – PPO | Admitting: Genetic Counselor

## 2021-07-01 NOTE — Progress Notes (Signed)
Post-test Genetic Consultation notes  ?Brady Johnson is here today for his post-test genetic consult of HCM genetic testing  ? ?Brady Johnson reports no significant changes since we last met and states that he feels good except for some sciatica issues. We reviewed his family pedigree and he notes no changes to their medical history. His younger brother had a normal Echo/EKG last week and both his daughters were screened last month for HCM. ? 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 I explained to him that this variant is not seen in the general population indicating that it is a very rare variant. However, the molecular features of this variant are not consistent with that of a pathogenic variant. Therefore, in the absence of functional assays and population studies to confirm the pathogenicity of this variant, it is classified as a VUS. Brady Johnson expressed understanding of this. I informed him that we have seen this variant in our African American HCM population and it is most likely a benign variant that is common amongst the African population. ? ?Brady Johnson has two daughters, ages 58 and 73. As the TNNT2 A192T variant is a VUS that seems like a rare benign variant, his daughters and siblings need not pursue genetic testing for the familial variant. It is recommended that they pursue regular surveillance for HCM per AHA guidelines. Brady Johnson is agreeable with this plan of action. ? ?Please note that the patient has not been counseled in this visit on personal, cultural, or ethical issues that he may face due to his heart condition.  ? ?Lattie Corns, Ph.D, [facility] ?Clinical Molecular Geneticist ?

## 2021-07-07 ENCOUNTER — Other Ambulatory Visit: Payer: Self-pay | Admitting: Internal Medicine

## 2021-07-17 ENCOUNTER — Other Ambulatory Visit: Payer: Self-pay | Admitting: Internal Medicine

## 2021-07-17 DIAGNOSIS — I422 Other hypertrophic cardiomyopathy: Secondary | ICD-10-CM

## 2021-07-28 IMAGING — MR MR CARD MORPHOLOGY WO/W CM
46 of 48 series · 46 of 48 positions shown · IV contrast (gadavist)
Comparison: none

CLINICAL DATA: Evaluate for HCM

EXAM:
CARDIAC MRI
TECHNIQUE: The patient was scanned on a 1.5 Tesla Siemens magnet. A dedicated
cardiac coil was used. Functional imaging was done using Fiesta
sequences. [DATE], and 4 chamber views were done to assess for RWMA's.
Modified Zeinab rule using a short axis stack was used to
calculate an ejection fraction on a dedicated work station using
Circle software. The patient received 12 cc of Gadavist. After 10
minutes inversion recovery sequences were used to assess for
infiltration and scar tissue.
CONTRAST:  12 cc  of Gadavist

[Series 6: bSSFP · sagittal · 8.0mm · 1.61mm/px · 1 of 25 slices shown (1 of 21)]
[im 1/25]
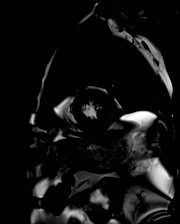

[Series 6: bSSFP · sagittal · 8.0mm · 1.61mm/px · 1 of 25 slices shown (2 of 21)]
[im 1/25]
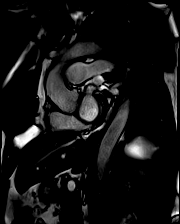

[Series 6: bSSFP · sagittal · 8.0mm · 1.61mm/px · 1 of 25 slices shown (3 of 21)]
[im 1/25]
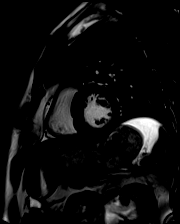

[Series 6: bSSFP · sagittal · 8.0mm · 1.61mm/px · 1 of 25 slices shown (4 of 21)]
[im 1/25]
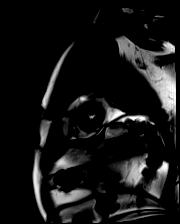

[Series 6: bSSFP · sagittal · 8.0mm · 1.61mm/px · 1 of 25 slices shown (5 of 21)]
[im 1/25]
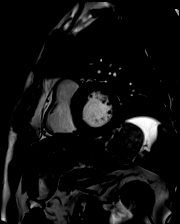

[Series 6: bSSFP · sagittal · 8.0mm · 1.61mm/px · 1 of 25 slices shown (6 of 21)]
[im 1/25]
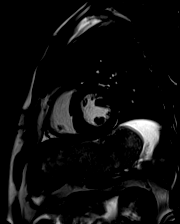

[Series 6: bSSFP · sagittal · 8.0mm · 1.61mm/px · 1 of 25 slices shown (7 of 21)]
[im 1/25]
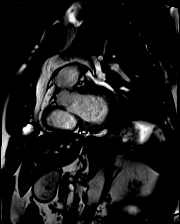

[Series 6: bSSFP · sagittal · 8.0mm · 1.61mm/px · 1 of 25 slices shown (8 of 21)]
[im 1/25]
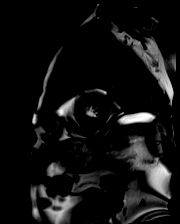

[Series 6: bSSFP · sagittal · 8.0mm · 1.61mm/px · 1 of 25 slices shown (9 of 21)]
[im 1/25]
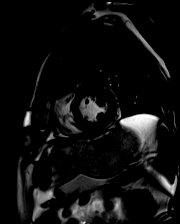

[Series 6: bSSFP · sagittal · 8.0mm · 1.61mm/px · 1 of 25 slices shown (10 of 21)]
[im 1/25]
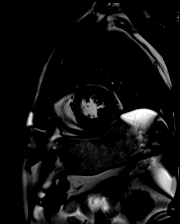

[Series 6: bSSFP · sagittal · 8.0mm · 1.61mm/px · 1 of 25 slices shown (11 of 21)]
[im 1/25]
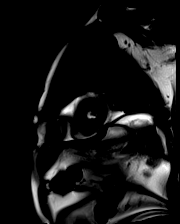

[Series 6: bSSFP · sagittal · 8.0mm · 1.61mm/px · 1 of 25 slices shown (12 of 21)]
[im 1/25]
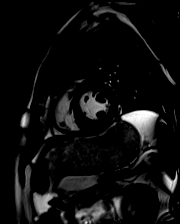

[Series 6: bSSFP · sagittal · 8.0mm · 1.61mm/px · 1 of 25 slices shown (13 of 21)]
[im 1/25]
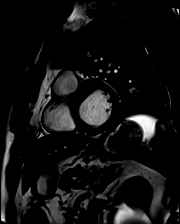

[Series 6: bSSFP · sagittal · 8.0mm · 1.61mm/px · 1 of 25 slices shown (14 of 21)]
[im 1/25]
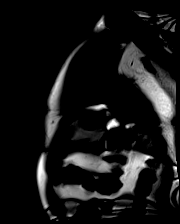

[Series 6: bSSFP · sagittal · 8.0mm · 1.61mm/px · 1 of 25 slices shown (15 of 21)]
[im 1/25]
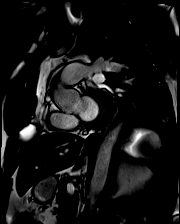

[Series 6: bSSFP · sagittal · 8.0mm · 1.61mm/px · 1 of 25 slices shown (16 of 21)]
[im 1/25]
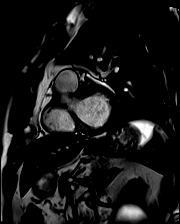

[Series 6: bSSFP · sagittal · 8.0mm · 1.61mm/px · 1 of 25 slices shown (17 of 21)]
[im 1/25]
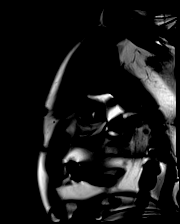

[Series 7: (id)_long_t1 · sagittal · 8.0mm · 1.41mm/px · 1 of 24 slices shown]
[im 1/24]
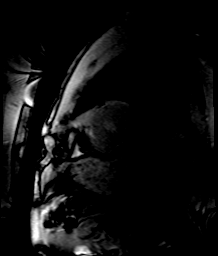

[Series 8: (id)_long_t1_moco · sagittal · 8.0mm · 1.41mm/px · 1 of 24 slices shown]
[im 1/24]
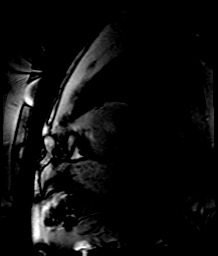

[Series 9: (id)_long_t1_moco_t1 · sagittal · 8.0mm · 1.41mm/px · 1 of 6 slices shown]
[im 1/6]
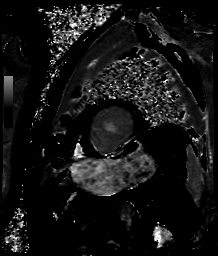

[Series 11: (id)_trufi · sagittal · 8.0mm · 1.88mm/px · 1 of 9 slices shown]
[im 1/9]
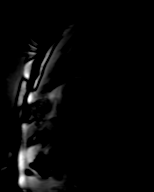

[Series 12: (id)_trufi_moco · sagittal · 8.0mm · 1.88mm/px · 1 of 9 slices shown]
[im 1/9]
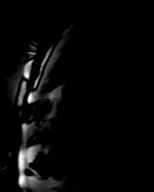

[Series 13: (id)_trufi_moco_t2 · sagittal · 8.0mm · 1.88mm/px · 1 of 3 slices shown]
[im 1/3]
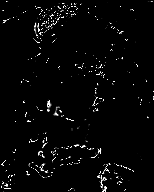

[Series 15: t2_stir_db_radial ((date)ch) · axial · 6.0mm · 1.73mm/px · 1 of 2 slices shown]
[im 1/2]
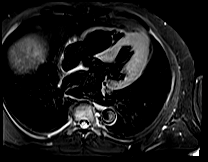

[Series 16: bSSFP · axial · 7.0mm · 1.41mm/px · 1 of 25 slices shown (18 of 21)]
[im 1/25]
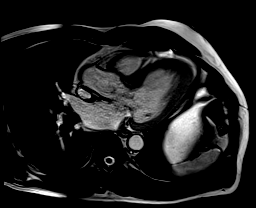

[Series 17: bSSFP · axial · 7.0mm · 1.41mm/px · 1 of 25 slices shown (19 of 21)]
[im 1/25]
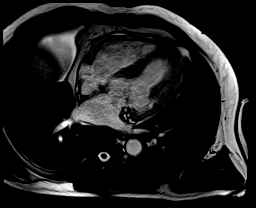

[Series 18: bSSFP · coronal · 7.0mm · 1.41mm/px · 1 of 25 slices shown (20 of 21)]
[im 1/25]
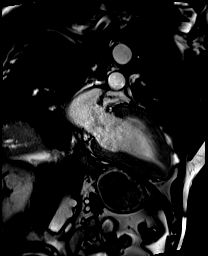

[Series 19: bSSFP · axial · 7.0mm · 1.41mm/px · 1 of 25 slices shown (21 of 21)]
[im 1/25]
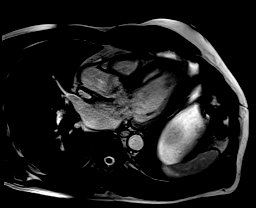

[Series 21: lge_single shot sa · sagittal · 8.0mm · 1.98mm/px · 1 of 17 slices shown (1 of 2)]
[im 1/17]
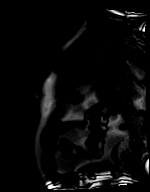

[Series 22: lge_single shot sa · sagittal · 8.0mm · 1.98mm/px · 1 of 17 slices shown (2 of 2)]
[im 1/17]
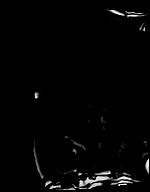

[Series 23: lge_single shot radial_mag · axial · 6.0mm · 1.98mm/px · 1 of 1 slices shown (1 of 2)]
[im 1/1]
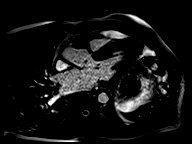

[Series 24: lge_single shot radial_psir · axial · 6.0mm · 1.98mm/px · 1 of 1 slices shown (1 of 2)]
[im 1/1]
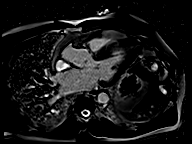

[Series 25: lge_single shot radial_mag · axial · 6.0mm · 1.98mm/px · 1 of 1 slices shown (2 of 2)]
[im 1/1]
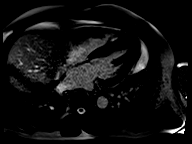

[Series 26: lge_single shot radial_psir · axial · 6.0mm · 1.98mm/px · 1 of 1 slices shown (2 of 2)]
[im 1/1]
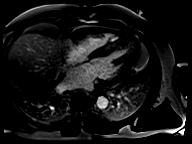

[Series 29: (id)_short_t1 · sagittal · 8.0mm · 1.41mm/px · 1 of 27 slices shown]
[im 1/27]
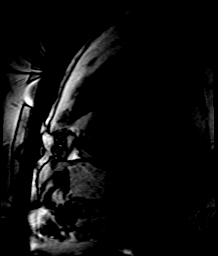

[Series 30: (id)_short_t1_moco · sagittal · 8.0mm · 1.41mm/px · 1 of 27 slices shown]
[im 1/27]
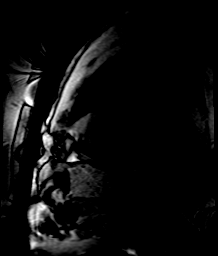

[Series 31: (id)_short_t1_moco_t1 · sagittal · 8.0mm · 1.41mm/px · 1 of 6 slices shown]
[im 1/6]
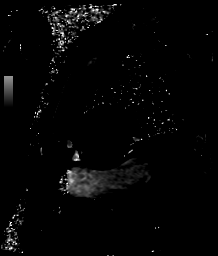

[Series 34: lge short axis_mag · sagittal · 8.0mm · 1.61mm/px · 1 of 17 slices shown]
[im 1/17]
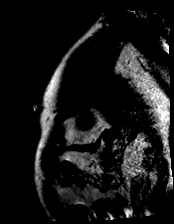

[Series 35: lge short axis_psir · sagittal · 8.0mm · 1.61mm/px · 1 of 17 slices shown]
[im 1/17]
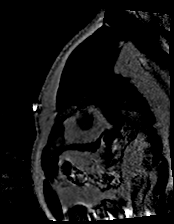

[Series 36: lge radial ((date)ch)_mag · axial · 6.0mm · 1.61mm/px · 1 of 1 slices shown (1 of 4)]
[im 1/1]
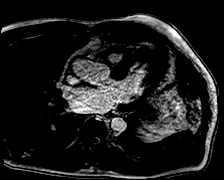

[Series 37: lge radial ((date)ch)_psir · axial · 6.0mm · 1.61mm/px · 1 of 1 slices shown (1 of 3)]
[im 1/1]
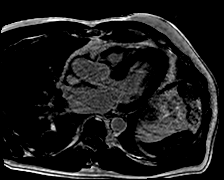

[Series 38: lge radial ((date)ch)_mag · axial · 6.0mm · 1.61mm/px · 1 of 1 slices shown (2 of 4)]
[im 1/1]
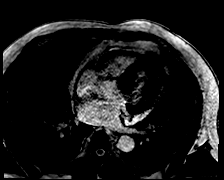

[Series 39: lge radial ((date)ch)_psir · axial · 6.0mm · 1.61mm/px · 1 of 1 slices shown (2 of 3)]
[im 1/1]
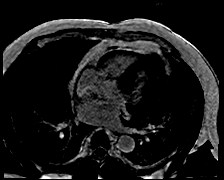

[Series 40: lge radial ((date)ch)_mag · axial · 6.0mm · 1.61mm/px · 1 of 1 slices shown (3 of 4)]
[im 1/1]
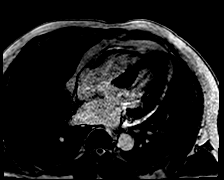

[Series 41: lge radial ((date)ch)_psir · axial · 6.0mm · 1.61mm/px · 1 of 1 slices shown (3 of 3)]
[im 1/1]
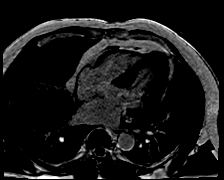

[Series 42: lge radial ((date)ch)_mag · axial · 6.0mm · 1.61mm/px · 1 of 1 slices shown (4 of 4)]
[im 1/1]
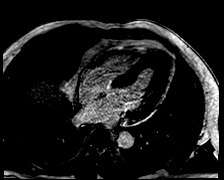

[46 of 48 positions shown; findings below may reference images not displayed]

FINDINGS: Left ventricle:

-Normal size

-Normal systolic function

-Asymmetric hypertrophy measuring up to 20mm in basal anteroseptum
(10mm in posterior wall). Apex is also hypertrophied, measuring up
to 18mm

-Normal ECV (26%)

-LVOT flow acceleration with peak resting gradient 16 mmHg

-Patchy LGE in septum and apex, accounts for 4% of myocardial mass

LV EF: 68% (Normal 56-78%)

Absolute volumes:

LV EDV: 158mL (Normal 77-195 mL)

LV ESV: 51mL (Normal 19-72 mL)

LV SV: 107mL (Normal 51-133 mL)

CO: 7.1L/min (Normal 2.8-8.8 L/min)

Indexed volumes:

LV EDV: 74mL/sq-m (Normal 47-92 mL/sq-m)

LV ESV: 24mL/sq-m (Normal 13-30 mL/sq-m)

LV SV: 50mL/sq-m (Normal 32-62 mL/sq-m)

CI: 3.5L/min/sq-m (Normal 1.7-4.2 L/min/sq-m)

Right ventricle:

RV EF:  79% (Normal 47-74%)

Absolute volumes:

RV EDV: 129mL (Normal 88-227 mL)

RV ESV: 27mL (Normal 23-103 mL)

RV SV: 102mL (Normal 52-138 mL)

CO: 6.8L/min (Normal 2.8-8.8 L/min)

Indexed volumes:

RV EDV: 60mL/sq-m (Normal 55-105 mL/sq-m)

RV ESV: 13mL/sq-m (Normal 15-43 mL/sq-m)

RV SV: 48mL/sq-m (Normal 32-64 mL/sq-m)

CI: 3.2L/min/sq-m (Normal 1.7-4.2 L/min/sq-m)

Left atrium: Moderate enlargement

Right atrium: Mild enlargement

Mitral valve: No regurgitation

Aortic valve: No regurgitation

Tricuspid valve: No regurgitation

Pericardium: Normal
IMPRESSION: 1. Asymmetric LV hypertrophy measuring up to 20mm in basal
anteroseptum (10mm in posterior wall), consistent with hypertrophic
cardiomyopathy. There is also significant apical hypertrophy,
measuring up to 18mm in apical inferior wall

2. LVOT flow acceleration with peak resting gradient measured at 16
mmHg

3. Patchy late gadolinium enhancement in septum and apex, consistent
with HCM. LGE accounts for 4% of total myocardial mass

4. Normal LV size and systolic function (EF 68%)

5. Normal RV size and systolic function (EF 79%)

## 2021-08-05 NOTE — Telephone Encounter (Signed)
Scheduled

## 2021-09-02 ENCOUNTER — Other Ambulatory Visit: Payer: Self-pay | Admitting: Physician Assistant

## 2021-09-10 ENCOUNTER — Ambulatory Visit: Payer: BC Managed Care – PPO | Admitting: Internal Medicine

## 2021-09-10 ENCOUNTER — Encounter: Payer: Self-pay | Admitting: Internal Medicine

## 2021-09-10 VITALS — BP 110/72 | HR 58 | Ht 68.0 in | Wt 214.0 lb

## 2021-09-10 DIAGNOSIS — R002 Palpitations: Secondary | ICD-10-CM | POA: Diagnosis not present

## 2021-09-10 DIAGNOSIS — E782 Mixed hyperlipidemia: Secondary | ICD-10-CM

## 2021-09-10 DIAGNOSIS — I2510 Atherosclerotic heart disease of native coronary artery without angina pectoris: Secondary | ICD-10-CM

## 2021-09-10 DIAGNOSIS — I7121 Aneurysm of the ascending aorta, without rupture: Secondary | ICD-10-CM

## 2021-09-10 DIAGNOSIS — I421 Obstructive hypertrophic cardiomyopathy: Secondary | ICD-10-CM | POA: Diagnosis not present

## 2021-09-10 DIAGNOSIS — I10 Essential (primary) hypertension: Secondary | ICD-10-CM | POA: Diagnosis not present

## 2021-09-10 NOTE — Patient Instructions (Signed)

## 2021-09-10 NOTE — Progress Notes (Signed)
Follow-up Outpatient Visit Date: 09/10/2021  Primary Care Provider: [facility] [place] [id number]  Chief Complaint: Follow-up hypertrophic cardiomyopathy  HPI:  Brady Johnson is a 61 y.o. male with history of  hypertrophic cardiomyopathy (previously seen by genetics and EP), hypertension, hyperlipidemia, obstructive sleep apnea, and gout, who presents for follow-up of hypertrophic cardiomyopathy.  He was last seen in our office in November by Christell Faith, PA, at which time he was doing well.  He noted to episodes of mild dizziness with exertion but no syncope or presyncope.  He has continued to follow with Dr. Broadus John in the genetics clinic.  Today, Brady Johnson reports that he has been feeling fairly well, though he notes that he gets out of breath a little bit more easily when climbing a long flight of stairs.  He sometimes also feels like his heart is racing a bit when he does strenuous activities such as playing basketball with his family.  This is sometimes accompanied by transient lightheadedness.  He has not passed out.  He has also put on a little bit of weight from prior visits.  He denies edema as well as dyspnea at rest and chest pain.  Home blood pressures are typically 120-130/70-80.  He has been dealing with sciatica and was previously on meloxicam.  He is now taking acetaminophen and gabapentin due to some abnormal labs that were noted while on meloxicam.  --------------------------------------------------------------------------------------------------  Cardiovascular History & Procedures: Cardiovascular Problems: Hypertrophic cardiomyopathy Dilated ascending aorta   Risk Factors: Hypertension, hyperlipidemia, obesity, male gender, family history and age > 36   Cath/PCI: LHC (03/29/2019): No significant coronary artery disease.  LVEF 60%.   CV Surgery: None   EP Procedures and Devices: Event monitor (07/18/2019): Predominantly sinus  rhythm with occasional PACs and rare PVCs.  Single episode of NSVT lasting 7 beats was observed, as well as atrial runs lasting up to 12 beats.   Non-Invasive Evaluation(s): CTA chest (03/28/2021): Minor fusiform aneurysmal dilation of the ascending aorta, measuring up to 4.0 cm.  Aortic atherosclerosis and coronary artery calcification noted. TTE (09/05/2019): Normal LV size with severe LVH.  LVEF greater than 75% with grade 2 diastolic dysfunction.  Left atrial pressure elevated.  Normal RV size and function.  Mild left atrial enlargement.  No significant valvular abnormality.  Mild dilation of ascending aorta noted, measuring 4.1 cm. Cardiac MRI (06/20/2019): Asymmetric LV hypertrophy measuring 2.0 cm in basal anteroseptum and 1.0 cm and posterior wall consistent with hypertrophic cardiomyopathy.  There is also significant apical hypertrophy measuring up to 1.8 cm.  Resting LVOT gradient of 16 mmHg.  Patchy late gadolinium enhancement noted.  LVEF 68%. TTE (05/11/19): Normal LV size with moderate to severe, asymmetric left ventricular hypertrophy.  LVEF 60-65% with grade 1 diastolic dysfunction.  Mild resting LVOT gradient noted (~15 mmHg).  Normal RV size and function.  Moderate left atrial enlargement.  Mild aortic root dilation (4.0 cm).  Recent CV Pertinent Labs: Lab Results  Component Value Date   CHOL 131 07/25/2019   HDL 38 (L) 07/25/2019   LDLCALC 71 07/25/2019   TRIG 111 07/25/2019   CHOLHDL 3.4 07/25/2019   INR 1.0 03/29/2019   K 4.1 03/12/2021   K 3.6 06/25/2012   BUN 21 (H) 03/12/2021   BUN 17 06/25/2012   CREATININE 1.31 (H) 03/12/2021   CREATININE 1.33 (H) 06/25/2012    Past medical and surgical history were reviewed and updated in EPIC.  Current  Meds  Medication Sig   amLODipine (NORVASC) 10 MG tablet TAKE 1 TABLET BY MOUTH EVERY DAY   aspirin EC 81 MG tablet Take 81 mg by mouth daily.   atorvastatin (LIPITOR) 20 MG tablet TAKE 1 TABLET BY MOUTH EVERY DAY   baclofen  (LIORESAL) 10 MG tablet Take 10 mg by mouth 3 (three) times daily.   colchicine 0.6 MG tablet Take 1 tablet by mouth daily as needed.    gabapentin (NEURONTIN) 100 MG capsule Take 100 mg by mouth in the morning and at bedtime.   losartan (COZAAR) 100 MG tablet Take 100 mg by mouth daily.   metoprolol succinate (TOPROL-XL) 50 MG 24 hr tablet TAKE 1 TABLET BY MOUTH EVERY DAY   sildenafil (REVATIO) 20 MG tablet Take 20 mg by mouth as needed.    Allergies: Patient has no known allergies.  Social History   Tobacco Use   Smoking status: Never   Smokeless tobacco: Never  Vaping Use   Vaping Use: Never used  Substance Use Topics   Alcohol use: No   Drug use: No    Family History  Problem Relation Age of Onset   Heart attack Father 16   Heart attack Nephew 33   Heart failure Neg Hx    Sudden Cardiac Death Neg Hx     Review of Systems: A 12-system review of systems was performed and was negative except as noted in the HPI.  --------------------------------------------------------------------------------------------------  Physical Exam: BP 110/72 (BP Location: Left Arm, Patient Position: Sitting, Cuff Size: Large)   Pulse (!) 58   Ht 5\' 8"  (1.727 m)   Wt 214 lb (97.1 kg)   SpO2 97%   BMI 32.54 kg/m   General:  NAD. Neck: No JVD or HJR. Lungs: Clear to auscultation bilaterally without wheezes or crackles. Heart: Regular rate and rhythm with 1/6 systolic murmur.  No rubs or gallops. Abdomen: Soft, nontender, nondistended. Extremities: No lower extremity edema.  EKG: Normal sinus rhythm with PACs, left atrial enlargement, LVH, and marked ST/T abnormality consistent with abnormal repolarization in the setting of known hypertrophic cardiomyopathy.  PACs are new since 03/12/2021.  Otherwise, no significant interval change.  Lab Results  Component Value Date   WBC 7.2 03/29/2019   HGB 15.3 03/29/2019   HCT 45.6 03/29/2019   MCV 90.3 03/29/2019   PLT 231 03/29/2019    Lab  Results  Component Value Date   NA 137 03/12/2021   K 4.1 03/12/2021   CL 108 03/12/2021   CO2 25 03/12/2021   BUN 21 (H) 03/12/2021   CREATININE 1.31 (H) 03/12/2021   GLUCOSE 98 03/12/2021   ALT 15 07/25/2019    Lab Results  Component Value Date   CHOL 131 07/25/2019   HDL 38 (L) 07/25/2019   LDLCALC 71 07/25/2019   TRIG 111 07/25/2019   CHOLHDL 3.4 07/25/2019    --------------------------------------------------------------------------------------------------  ASSESSMENT AND PLAN: Hypertrophic obstructive cardiomyopathy: Brady Johnson reports slight increase in exertional dyspnea.  He appears euvolemic on exam today.  We will continue current medications.  If he were to have progression of symptoms, we would consider referral to Dr. Gasper Sells in [facility] for further evaluation and management.  He was previously evaluated by EP, at which time ICD was not recommended.  Palpitations: Brady Johnson is aware of faster heart rates when he exercises though he otherwise denies palpitations.  We discussed exercise tolerance test to assess for exercised induced arrhythmias, particularly VT, in the setting of his HCM.  However, Brady Johnson wishes to defer this.  We will continue current dose of metoprolol succinate 50 mg daily given resting heart rate in the 50s.  Hypertension: Blood pressure well controlled today.  Continue current regimen of amlodipine, losartan, and metoprolol.  If lightheadedness worsens or blood pressure drops further, we would need to consider decreasing amlodipine and/or losartan in order to minimize afterload reduction and its potential effects on hypertrophic obstructive cardiomyopathy.  Nonobstructive coronary artery disease, thoracic aortic aneurysm, and hyperlipidemia: No angina reported.  CTA chest in 03/2021 showed stable mild dilation of the ascending aorta at 4.0 cm.  Continue aspirin and statin therapy as well as blood pressure controlled outlined above.  We  will need to consider repeat echo or CTA of the aorta at our next visit in 6 months to ensure stability of TAA.  Follow-up: Return to clinic in 6 months.  Nelva Bush, MD 09/10/2021 9:19 AM

## 2021-10-26 ENCOUNTER — Other Ambulatory Visit: Payer: Self-pay | Admitting: Internal Medicine

## 2021-11-24 ENCOUNTER — Other Ambulatory Visit: Payer: Self-pay | Admitting: Internal Medicine

## 2021-11-24 DIAGNOSIS — I422 Other hypertrophic cardiomyopathy: Secondary | ICD-10-CM

## 2021-11-29 ENCOUNTER — Other Ambulatory Visit: Payer: Self-pay | Admitting: Internal Medicine

## 2022-01-02 ENCOUNTER — Ambulatory Visit: Payer: BC Managed Care – PPO | Admitting: Physician Assistant

## 2022-01-02 NOTE — Progress Notes (Unsigned)
Cardiology Office Note    Date:  01/07/2022   ID:  DAREON NUNZIATO, DOB 1961/03/01, MRN [id number]  PCP:  [facility]  Cardiologist:  Nelva Bush, MD  Electrophysiologist:  Virl Axe, MD   Chief Complaint: Evaluate heart following URI  History of Present Illness:   Brady Johnson is a 61 y.o. male with history of normal coronary arteries by LHC in 03/2019, hypertrophic cardiomyopathy, HTN, HLD, OSA, and gout who presents for evaluation of his cardiomyopathy following URI.   LHC from 03/2019 demonstrated no significant CAD with an LVEF of 60%.  Echo from 04/2019 demonstrated an EF of 60 to 65%, moderate to severely increased LVH, no regional wall motion abnormalities, near cavity obliteration in systole with an LVOT gradient of 15 mmHg with no Valsalva measurements available, grade 1 diastolic dysfunction, normal RV systolic function and ventricular cavity size with no increase in RV wall thickness, moderately dilated left atrium, mild dilatation of the aortic root.  Cardiac MRI in 06/2019 showed asymmetric LVH measuring up to 20 mm in the basal anteroseptum, 10 mm in the posterior wall, as well as significant apical hypertrophy measuring up to 18 mm in the apical inferior wall, consistent with hypertrophic cardiomyopathy.  LVOT flow acceleration with peak resting gradient measured at 16 mmHg.  Patchy LGE in the septum and apex consistent with hypertrophic cardiomyopathy with LGE accounting for 4% of total myocardial mass.  He was referred to cardiac geneticist and EP.  Outpatient cardiac monitoring in 07/2019 demonstrated a predominant rhythm of sinus with an average rate of 69 bpm, 8 atrial runs lasting up to 12 beats, single episode of NSVT lasting 7 beats, and no sustained arrhythmias or prolonged pauses.  He was evaluated by EP in 08/2019 with recommendation to repeat echo with Valsalva which demonstrated hyperdynamic LV systolic function with an EF greater than  75%, no regional wall motion abnormalities, severe LVH most prominent in the septum and apex, grade 2 diastolic dysfunction, mildly dilated ascending aorta, trace aortic insufficiency, chordal S.A.M. without significant LVOT gradient at rest or with Valsalva and mild left atrial enlargement.  Findings were consistent with hypertrophic cardiomyopathy.  EP deemed him low risk for sudden death and deferred ICD implantation.   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   He was seen in the office in 07/2020 and was doing relatively well from a cardiac perspective.  He was trying to be more active.  He did note 1 episode of palpitations/lightheadedness while running without syncope.  Given symptoms, he underwent ETT on 08/09/2020 which demonstrated good exercise capacity with blunted heart rate response and no significant ST-T changes being observed.  Baseline EKG demonstrated sinus bradycardia with LVH and diffuse ST-T abnormalities consistent with LVH/HCM.  There were no significant arrhythmias.  This was a nondiagnostic exercise stress test for ischemia given inability to reach target heart rate and baseline ST-T changes.  Appropriate blood pressure response was noted during the stress in the setting of known hypertrophic cardiomyopathy.  CTA of the chest in 03/2021 showed stable mild dilatation of the ascending aorta, measuring 4.0 cm.  He has had continued follow-up with Dr. Broadus John in the genetics clinic, with last note in 06/2021 indicating the patient's younger brother had a normal echo/EKG and both the patient's daughters were screened as well.  He was last seen in the office in 08/2021 and was  doing reasonably well, though did get short of breath a bit more easily when climbing a long flight of stairs.  He also felt like his heart was racing when undergoing strenuous activities such  as playing basketball with his family.  This was sometimes associated with transient lightheadedness without syncope.  He was continued on current medical therapy.  He comes in today and is doing well from a cardiac perspective, without symptoms of frank angina or decompensation.  He does report he has been dealing with a prolonged URI for the past several months.  He feels like he and his wife keep sharing URIs back-and-forth.  He has been evaluated by his PCP several times for this with noted improvement following antibiotic and steroid use.  He reports chest x-ray was unrevealing.  He notes chest congestion with a cough that is productive of green to yellow sputum.  Currently, symptoms are improving.  However, he would like to ensure he is stable from a cardiomyopathy perspective given chest tightness associated with his URI.  He also reports a recent family member recently passed from an MI at age 74.  He notes stable lightheadedness with activity such as playing basketball with the local [employer] without frank syncope.  His weight is up 2 pounds today by our scale when compared to his visit in 08/2021.  He attributes some weight gain to recently started gabapentin.  Overall, in the office today, he feels like he is improving from a URI perspective.   Labs independently reviewed: 02/2021 - potassium 4.1, BUN 21, serum creatinine 1.31 02/2020 - Hgb 15.0, PLT 214 07/2019 - ALT normal, TC 131, TG 111, HDL 38, LDL 71 03/2019 - A1c 5.6  Past Medical History:  Diagnosis Date   Erectile dysfunction    Gout    Hyperandrogenism    Hyperlipidemia    Hypertension    Hypertrophic cardiomyopathy (HCC)    Sleep apnea     Past Surgical History:  Procedure Laterality Date   CARDIAC CATHETERIZATION     CHOANAL ADENIODECTOMY     COLONOSCOPY N/A 05/26/2021   Procedure: COLONOSCOPY;  Surgeon: Regis Bill, MD;  Location: [facility] ENDOSCOPY;  Service: Endoscopy;  Laterality: N/A;   COLONOSCOPY  WITH PROPOFOL N/A 05/13/2015   Procedure: COLONOSCOPY WITH PROPOFOL;  Surgeon: Christena Deem, MD;  Location: [facility] ENDOSCOPY;  Service: Endoscopy;  Laterality: N/A;   LEFT HEART CATH AND CORONARY ANGIOGRAPHY N/A 03/29/2019   Procedure: LEFT HEART CATH AND CORONARY ANGIOGRAPHY and possible PCI and stent;  Surgeon: Alwyn Pea, MD;  Location: [facility] INVASIVE CV LAB;  Service: Cardiovascular;  Laterality: N/A;   TONSILLECTOMY     TONSILLECTOMY AND ADENOIDECTOMY      Current Medications: Current Meds  Medication Sig   amLODipine (NORVASC) 10 MG tablet TAKE 1 TABLET BY MOUTH EVERY DAY   aspirin EC 81 MG tablet Take 81 mg by mouth daily.   atorvastatin (LIPITOR) 20 MG tablet TAKE 1 TABLET BY MOUTH EVERY DAY   baclofen (LIORESAL) 10 MG tablet Take 10 mg by mouth 3 (three) times daily.   colchicine 0.6 MG tablet Take 1 tablet by mouth daily as needed.    gabapentin (NEURONTIN) 100 MG capsule Take 100 mg by mouth in the morning and at bedtime.   losartan (COZAAR) 100 MG tablet Take 100 mg by mouth daily.   metoprolol succinate (TOPROL-XL) 50 MG 24 hr tablet TAKE 1 TABLET BY MOUTH EVERY DAY   sildenafil (REVATIO) 20 MG  tablet Take 20 mg by mouth as needed.    Allergies:   Patient has no known allergies.   Social History   Socioeconomic History   Marital status: Married    Spouse name: Not on file   Number of children: Not on file   Years of education: Not on file   Highest education level: Not on file  Occupational History   Not on file  Tobacco Use   Smoking status: Never   Smokeless tobacco: Never  Vaping Use   Vaping Use: Never used  Substance and Sexual Activity   Alcohol use: No   Drug use: No   Sexual activity: Not on file  Other Topics Concern   Not on file  Social History Narrative   Not on file   Social Determinants of Health   Financial Resource Strain: Not on file  Food Insecurity: Not on file  Transportation Needs: Not on file  Physical Activity: Not on  file  Stress: Not on file  Social Connections: Not on file     Family History:  The patient's family history includes Heart attack (age of onset: 19) in his nephew; Heart attack (age of onset: 82) in his father. There is no history of Heart failure or Sudden Cardiac Death.  ROS:   Review of Systems  Constitutional:  Positive for chills and malaise/fatigue. Negative for diaphoresis, fever and weight loss.  HENT:  Positive for congestion.   Eyes:  Negative for discharge and redness.  Respiratory:  Positive for cough and sputum production. Negative for shortness of breath and wheezing.        Cough productive of green to yellow sputum.  Cardiovascular:  Positive for palpitations. Negative for orthopnea, claudication, leg swelling and PND.       Chest tightness.  Gastrointestinal:  Negative for abdominal pain, heartburn, nausea and vomiting.  Musculoskeletal:  Negative for falls and myalgias.  Skin:  Negative for rash.  Neurological:  Positive for dizziness. Negative for tingling, tremors, sensory change, speech change, focal weakness, loss of consciousness and weakness.  Endo/Heme/Allergies:  Does not bruise/bleed easily.  Psychiatric/Behavioral:  Negative for substance abuse. The patient is not nervous/anxious.   All other systems reviewed and are negative.    EKGs/Labs/Other Studies Reviewed:    Studies reviewed were summarized above. The additional studies were reviewed today:  ETT 08/09/2020: Baseline EKG demonstrates sinus bradycardia with LVH and diffuse ST/T abnormalities consistent with LVH/HCM. The patient demonstrates good exercise capacity with blunted heart rate response. Target heart rate was not achieved. The test was stopped due to shortness of breath. Blood pressure response was normal. No significant change in baseline ST/T abnormalities was observed. There were no significant arrhythmias.   Non-diagnostic exercise tolerance test for ischemia, given inability to  reach target heart rate and baseline ST/T abnormalities.  Appropriate blood pressure response noted to stress in the setting of known hypertrophic cardiomyopathy. __________  2D echo 09/05/2019: 1. Definity used; hyperdynamic LV systolic function; severe LVH (most  prominent in the septum and apex); grade 2 diastolic dysfunction; mildly  dilated ascending aorta; trace AI; chordal SAM but no significant LVOT  gradient at rest or with valsalva; mild   LAE; findings suggest HCM.   2. Left ventricular ejection fraction, by estimation, is >75%. The left  ventricle has hyperdynamic function. The left ventricle has no regional  wall motion abnormalities. There is severe left ventricular hypertrophy.  Left ventricular diastolic  parameters are consistent with Grade II diastolic dysfunction  (  pseudonormalization). Elevated left atrial pressure.   3. Right ventricular systolic function is normal. The right ventricular  size is normal.   4. Left atrial size was mildly dilated.   5. The mitral valve is normal in structure. Trivial mitral valve  regurgitation. No evidence of mitral stenosis.   6. The aortic valve is tricuspid. Aortic valve regurgitation is trivial.  No aortic stenosis is present.   7. Aortic dilatation noted. There is mild dilatation of the ascending  aorta measuring 41 mm.   8. The inferior vena cava is normal in size with greater than 50%  respiratory variability, suggesting right atrial pressure of 3 mmHg. __________  Luci Bank patch 06/2019: The patient was monitored for 13 days, 19 hours. The predominant rhythm was sinus with an average rate of 69 bpm (range 50 to 121 bpm in sinus). There were occasional PACs and rare PVCs. Eight atrial runs lasting up to 12 beats with a maximum rate of 154 bpm were observed. There is a single episode of nonsustained ventricular tachycardia lasting 7 beats with a maximal rate of 146 bpm. There was no sustained arrhythmia or prolonged  pause. Patient triggered events correspond to sinus rhythm with PACs and PVCs.   Predominantly sinus rhythm with occasional PACs and rare PVCs.  Single episode of NSVT lasting 7 beats was observed, as well as brief atrial runs lasting up to 12 beats. __________  Cardiac MRI 06/20/2019: IMPRESSION: 1. Asymmetric LV hypertrophy measuring up to 20mm in basal anteroseptum (10mm in posterior wall), consistent with hypertrophic cardiomyopathy. There is also significant apical hypertrophy, measuring up to 18mm in apical inferior wall 2. LVOT flow acceleration with peak resting gradient measured at 16 mmHg 3. Patchy late gadolinium enhancement in septum and apex, consistent with HCM. LGE accounts for 4% of total myocardial mass 4. Normal LV size and systolic function (EF 68%) 5. Normal RV size and systolic function (EF 79%) __________  2D echo 05/12/2019: 1. Left ventricular ejection fraction, by visual estimation, is 60 to  65%. The left ventricle has normal function. There is moderate to severely  increased left ventricular hypertrophy (ivs 2 cm, posterior wall 1.5 cm).   2. Left ventricular diastolic parameters are consistent with Grade I  diastolic dysfunction (impaired relaxation).   3. The left ventricle has no regional wall motion abnormalities. Near  cavity obliteration in systole. LVOT gradient of 15 mm Hg. No valsalva  measurements available.   4. Global right ventricle has normal systolic function.The right  ventricular size is normal. No increase in right ventricular wall  thickness.   5. Left atrial size was moderately dilated.   6. There is mild dilatation of the aortic root.   7. TR signal is inadequate for assessing pulmonary artery systolic  pressure. __________  [facility] 03/29/2019 Gavin Potters): Normal overall left ventricular function ejection fraction of 60% Left dominant system No significant obstructive coronary disease just minor irregularities   Conclusion Benign  cardiac cath Normal left ventricular function Ejection fraction of 60% Successful right radial left heart cardiac cath   EKG:  EKG is ordered today.  The EKG ordered today demonstrates NSR, 64 bpm, rare PAC, marked ST/T abnormality consistent with abnormal repolarization in the setting of known hypertrophic cardiomyopathy, consistent with prior tracing  Recent Labs: 03/12/2021: BUN 21; Creatinine, Ser 1.31; Potassium 4.1; Sodium 137  Recent Lipid Panel    Component Value Date/Time   CHOL 131 07/25/2019 0717   TRIG 111 07/25/2019 0717   HDL 38 (L) 07/25/2019 1610  CHOLHDL 3.4 07/25/2019 0717   VLDL 22 07/25/2019 0717   LDLCALC 71 07/25/2019 0717    PHYSICAL EXAM:    VS:  BP 138/80 (BP Location: Left Arm, Patient Position: Sitting, Cuff Size: Normal)   Pulse 64   Ht  (1.727 m)   Wt 216 lb 6.4 oz (98.2 kg)   SpO2 96%   BMI 32.90 kg/m   BMI: Body mass index is 32.9 kg/m.  Physical Exam Vitals reviewed.  Constitutional:      Appearance: He is well-developed.  HENT:     Head: Normocephalic and atraumatic.  Eyes:     General:        Right eye: No discharge.        Left eye: No discharge.  Neck:     Vascular: No JVD.  Cardiovascular:     Rate and Rhythm: Normal rate and regular rhythm.     Pulses:          Posterior tibial pulses are 2+ on the right side and 2+ on the left side.     Heart sounds: S1 normal and S2 normal. Heart sounds not distant. No midsystolic click and no opening snap. Murmur heard.     Systolic murmur is present with a grade of 1/6 at the upper right sternal border.     No friction rub.  Pulmonary:     Effort: Pulmonary effort is normal. No respiratory distress.     Breath sounds: Normal breath sounds. No decreased breath sounds, wheezing or rales.  Chest:     Chest wall: No tenderness.  Abdominal:     General: There is no distension.  Musculoskeletal:     Cervical back: Normal range of motion.     Right lower leg: No edema.     Left  lower leg: No edema.  Skin:    General: Skin is warm and dry.     Nails: There is no clubbing.  Neurological:     Mental Status: He is alert and oriented to person, place, and time.  Psychiatric:        Speech: Speech normal.        Behavior: Behavior normal.        Thought Content: Thought content normal.        Judgment: Judgment normal.     Wt Readings from Last 3 Encounters:  01/07/22 216 lb 6.4 oz (98.2 kg)  09/10/21 214 lb (97.1 kg)  05/26/21 210 lb (95.3 kg)     ASSESSMENT & PLAN:   HOCM: He appears euvolemic and well compensated.  Continue current medications.  Should he have a progression of symptoms, would consider referral to Dr. Izora Ribas in [place] for further evaluation and management.  He was previously evaluated by EP, at which time ICD was not recommended.  Nonobstructive CAD/thoracic aortic aneurysm: Currently chest pain-free.  Schedule coronary CTA to evaluate for high risk ischemia and to trend his thoracic aortic aneurysm.  CTA of the chest in 03/2021 showed stable mild dilatation of the ascending aorta, measuring 4.0 cm.  Continue aggressive risk factor modification and primary prevention including aspirin, amlodipine, atorvastatin, losartan, and metoprolol.  HTN: Blood pressure is reasonably controlled in the office today.  Continue current medical therapy.  HLD: LDL 71 in 07/2019.  He remains on atorvastatin.  Palpitations/lightheadedness: He does continue to note some palpitations and lightheadedness with exercising, though has been without symptoms of frank syncope.  He has been educated regarding arrhythmias, particularly VT, in the  setting of HCM.  He remains on metoprolol succinate 50 mg daily with heart rate precluding further titration at this time.  URI: Improving.  No red flag symptoms at today's visit.    Disposition: F/u with Dr. Okey DupreEnd or an APP as scheduled in 02/2022, and EP as directed.   Medication Adjustments/Labs and Tests  Ordered: Current medicines are reviewed at length with the patient today.  Concerns regarding medicines are outlined above. Medication changes, Labs and Tests ordered today are summarized above and listed in the Patient Instructions accessible in Encounters.   Signed, Eula Listenyan Blaine Guiffre, PA-C 01/07/2022 9:16 AM     [facility] [place] [address] [place], [place] [address] [phone]

## 2022-01-07 ENCOUNTER — Encounter: Payer: Self-pay | Admitting: Physician Assistant

## 2022-01-07 ENCOUNTER — Ambulatory Visit: Payer: BC Managed Care – PPO | Attending: Physician Assistant | Admitting: Physician Assistant

## 2022-01-07 VITALS — BP 138/80 | HR 64 | Ht 68.0 in | Wt 216.4 lb

## 2022-01-07 DIAGNOSIS — R42 Dizziness and giddiness: Secondary | ICD-10-CM

## 2022-01-07 DIAGNOSIS — I421 Obstructive hypertrophic cardiomyopathy: Secondary | ICD-10-CM

## 2022-01-07 DIAGNOSIS — R072 Precordial pain: Secondary | ICD-10-CM

## 2022-01-07 DIAGNOSIS — I25118 Atherosclerotic heart disease of native coronary artery with other forms of angina pectoris: Secondary | ICD-10-CM | POA: Diagnosis not present

## 2022-01-07 DIAGNOSIS — I7121 Aneurysm of the ascending aorta, without rupture: Secondary | ICD-10-CM

## 2022-01-07 DIAGNOSIS — I10 Essential (primary) hypertension: Secondary | ICD-10-CM | POA: Diagnosis not present

## 2022-01-07 NOTE — Patient Instructions (Addendum)
Medication Instructions:  No changes at this time.  *If you need a refill on your cardiac medications before your next appointment, please call your pharmacy*   Lab Work: None  If you have labs (blood work) drawn today and your tests are completely normal, you will receive your results only by: [place] (if you have MyChart) OR A paper copy in the mail If you have any lab test that is abnormal or we need to change your treatment, we will call you to review the results.   Testing/Procedures: Your physician has requested that you have an echocardiogram. Echocardiography is a painless test that uses sound waves to create images of your heart. It provides your doctor with information about the size and shape of your heart and how well your heart's chambers and valves are working. This procedure takes approximately one hour. There are no restrictions for this procedure.    Your cardiac CT is scheduled at the below location:   [facility] [place], [place] [address] [phone]  Scheduled for 01/19/22 at 08:00 am. Please arrive at 07:45 for check-in and test prep.   Please follow these instructions carefully (unless otherwise directed):  Hold all erectile dysfunction medications at least 3 days (72 hrs) prior to test. (Ie viagra, cialis, sildenafil, tadalafil, etc) We will administer nitroglycerin during this exam.   On the Night Before the Test: Be sure to Drink plenty of water. Do not consume any caffeinated/decaffeinated beverages or chocolate 12 hours prior to your test. Do not take any antihistamines 12 hours prior to your test.  On the Day of the Test: Drink plenty of water until 1 hour prior to the test. You may take your regular medications prior to the test.  Take metoprolol (Lopressor) 100 mg two hours prior to test.        After the Test: Drink plenty of water. After receiving IV contrast, you may experience a mild  flushed feeling. This is normal. On occasion, you may experience a mild rash up to 24 hours after the test. This is not dangerous. If this occurs, you can take Benadryl 25 mg and increase your fluid intake. If you experience trouble breathing, this can be serious. If it is severe call [phone] IMMEDIATELY. If it is mild, please call our office. If you take any of these medications: Glipizide/Metformin, Avandament, Glucavance, please do not take 48 hours after completing test unless otherwise instructed.   For non-scheduling related questions, please contact the cardiac imaging nurse navigator should you have any questions/concerns: Marchia Bond, Cardiac Imaging Nurse Navigator Gordy Clement, Cardiac Imaging Nurse Navigator [facility] Direct Office Dial: [phone]   For scheduling needs, including cancellations and rescheduling, please call [place], [phone].    Follow-Up: At [facility], you and your health needs are our priority.  As part of our continuing mission to provide you with exceptional heart care, we have created designated Provider Care Teams.  These Care Teams include your primary Cardiologist (physician) and Advanced Practice Providers (APPs -  Physician Assistants and Nurse Practitioners) who all work together to provide you with the care you need, when you need it.  Your next appointment:   Keep scheduled appointment on 03/12/22 at 09:40 am  The format for your next appointment:   In Person  Provider:   Nelva Bush, MD         Important Information About Sugar

## 2022-01-16 ENCOUNTER — Telehealth (HOSPITAL_COMMUNITY): Payer: Self-pay | Admitting: Emergency Medicine

## 2022-01-16 NOTE — Telephone Encounter (Signed)
Attempted to call patient regarding upcoming cardiac CT appointment. °Left message on voicemail with name and callback number °Adedamola Seto RN Navigator Cardiac Imaging °[facility] °[phone] Office °[phone] Cell ° °

## 2022-01-19 ENCOUNTER — Ambulatory Visit
Admission: RE | Admit: 2022-01-19 | Discharge: 2022-01-19 | Disposition: A | Discharge status: Home / Self Care | Payer: BC Managed Care – PPO | Source: Ambulatory Visit | Attending: Physician Assistant | Admitting: Physician Assistant

## 2022-01-19 DIAGNOSIS — R072 Precordial pain: Secondary | ICD-10-CM | POA: Insufficient documentation

## 2022-01-19 LAB — POCT I-STAT CREATININE: Creatinine, Ser: 1.2 mg/dL (ref 0.61–1.24)

## 2022-01-19 MED ORDER — IOHEXOL 350 MG/ML SOLN
100.0000 mL | Freq: Once | INTRAVENOUS | Status: AC | PRN
  Administered 2022-01-19: 100 mL via INTRAVENOUS

## 2022-01-19 MED ORDER — NITROGLYCERIN 0.4 MG SL SUBL
0.8000 mg | SUBLINGUAL_TABLET | Freq: Once | SUBLINGUAL | Status: AC
  Administered 2022-01-19: 0.8 mg via SUBLINGUAL

## 2022-01-19 NOTE — Progress Notes (Signed)
Patient tolerated procedure well. Ambulate w/o difficulty. Denies any lightheadedness or being dizzy. Pt denies any pain at this time. Sitting in chair, pt is encouraged to drink additional water throughout the day and reason explained to patient. Patient verbalized understanding and all questions answered. ABC intact. No further needs at this time. Discharge from procedure area w/o issues.  

## 2022-01-20 ENCOUNTER — Telehealth: Payer: Self-pay | Admitting: *Deleted

## 2022-01-20 DIAGNOSIS — I7121 Aneurysm of the ascending aorta, without rupture: Secondary | ICD-10-CM

## 2022-01-20 NOTE — Telephone Encounter (Signed)
Left voicemail message to call back for review of results.  

## 2022-01-20 NOTE — Telephone Encounter (Signed)
-----   Message from Rise Mu, PA-C sent at 01/19/2022  3:28 PM EDT ----- Cardiac over read: -Calcium score 308 (86th percentile) -Mild nonobstructive blockages ranging from 25-49% in the LAD and LCx -Stable thoracic aortic aneurysm   Noncardiac over read: -No acute findings  Recommendations: -Overall reassuring findings with no evidence of blockages and a stable thoracic aortic aneurysm -Follow-up CTA aorta in 12 months as noted on prior imaging

## 2022-01-26 ENCOUNTER — Other Ambulatory Visit: Payer: Self-pay | Admitting: Internal Medicine

## 2022-01-26 NOTE — Telephone Encounter (Signed)
Reviewed results and recommendations with patient. He verbalized understanding with no further questions at this time.  

## 2022-02-04 ENCOUNTER — Ambulatory Visit: Payer: BC Managed Care – PPO | Attending: Physician Assistant

## 2022-02-04 DIAGNOSIS — I421 Obstructive hypertrophic cardiomyopathy: Secondary | ICD-10-CM | POA: Diagnosis not present

## 2022-02-04 LAB — ECHOCARDIOGRAM COMPLETE
AR max vel: 4.25 cm2
AV Area VTI: 5.03 cm2
AV Area mean vel: 4.19 cm2
AV Mean grad: 4 mmHg
AV Peak grad: 6.4 mmHg
AV Vena cont: 0.3 cm
Ao pk vel: 1.26 m/s
Area-P 1/2: 3.32 cm2
S' Lateral: 2.5 cm

## 2022-02-14 ENCOUNTER — Other Ambulatory Visit: Payer: Self-pay | Admitting: Internal Medicine

## 2022-02-21 ENCOUNTER — Other Ambulatory Visit: Payer: Self-pay | Admitting: Internal Medicine

## 2022-02-21 DIAGNOSIS — I422 Other hypertrophic cardiomyopathy: Secondary | ICD-10-CM

## 2022-02-28 ENCOUNTER — Other Ambulatory Visit: Payer: Self-pay | Admitting: Physician Assistant

## 2022-03-03 ENCOUNTER — Encounter: Payer: Self-pay | Admitting: *Deleted

## 2022-03-11 ENCOUNTER — Other Ambulatory Visit: Payer: Self-pay | Admitting: Internal Medicine

## 2022-03-12 ENCOUNTER — Encounter: Payer: Self-pay | Admitting: Internal Medicine

## 2022-03-12 ENCOUNTER — Ambulatory Visit: Payer: BC Managed Care – PPO | Attending: Internal Medicine | Admitting: Internal Medicine

## 2022-03-12 VITALS — BP 137/74 | HR 61 | Ht 68.0 in | Wt 218.0 lb

## 2022-03-12 DIAGNOSIS — E782 Mixed hyperlipidemia: Secondary | ICD-10-CM

## 2022-03-12 DIAGNOSIS — I10 Essential (primary) hypertension: Secondary | ICD-10-CM | POA: Diagnosis not present

## 2022-03-12 DIAGNOSIS — I2510 Atherosclerotic heart disease of native coronary artery without angina pectoris: Secondary | ICD-10-CM | POA: Diagnosis not present

## 2022-03-12 DIAGNOSIS — I77810 Thoracic aortic ectasia: Secondary | ICD-10-CM

## 2022-03-12 DIAGNOSIS — I422 Other hypertrophic cardiomyopathy: Secondary | ICD-10-CM

## 2022-03-12 MED ORDER — FUROSEMIDE 20 MG PO TABS: 20.0000 mg | ORAL_TABLET | Freq: Every day | ORAL | 3 refills | Status: DC

## 2022-03-12 NOTE — Progress Notes (Signed)
Follow-up Outpatient Visit Date: 03/12/2022  Primary Care Provider: [facility] [address] [place] [address]  Chief Complaint: Follow-up hypertrophic cardiomyopathy  HPI:  Brady Johnson is a 61 y.o. male with history of hypertrophic cardiomyopathy (previously seen by genetics and EP), hypertension, hyperlipidemia, obstructive sleep apnea, and gout, who presents for follow-up of hypertrophic cardiomyopathy.  He was last seen in our office in September by Eula Listen, PA, at which time he was doing fairly well though he was dealing with prolonged URI symptoms.  He was referred for coronary CTA for reevaluation of his nonobstructive CAD and thoracic aortic aneurysm.  Mild proximal LAD and LCx disease was noted as well as stabled mild dilation of the ascending aorta, measuring 3.9 cm.  Echo showed vigorous LV function with findings consistent with history of hypertrophic cardiomyopathy.  No significant LVOT obstruction was observed.  Today, Brady Johnson reports that he is feeling a little bit more fatigued and dizzy at times.  He wonders if he is just more aware of his HCM, though he finds himself having to rest a bit more when doing yard work or shooting basketball with his children.  He does not do any strenuous exercise.  He has had some occasional orthostatic lightheadedness as well but has not passed out or fallen.  Over the last 4 months, he has also experienced some orthopnea, to the point where he is now sleeping on 2 pillows or on his left side to help improve his breathing.  His weight has climbed about 8 pounds over the last year.  He has not had any frank edema.  He also denies chest pain and palpitations.  He drinks quite a bit of fluid during the day, both water and sweet tea.  --------------------------------------------------------------------------------------------------  Cardiovascular History & Procedures: Cardiovascular Problems: Hypertrophic  cardiomyopathy Dilated ascending aorta   Risk Factors: Hypertension, hyperlipidemia, obesity, male gender, family history and age > 29   Cath/PCI: LHC (03/29/2019): No significant coronary artery disease.  LVEF 60%.   CV Surgery: None   EP Procedures and Devices: Event monitor (07/18/2019): Predominantly sinus rhythm with occasional PACs and rare PVCs.  Single episode of NSVT lasting 7 beats was observed, as well as atrial runs lasting up to 12 beats.   Non-Invasive Evaluation(s): TTE (02/04/2022): Asymmetric septal hypertrophy measuring up to 1.8 cm, consistent with history of HCM.  No LVOT obstruction noted.  LVEF 65-70% with normal wall motion.  Grade 2 diastolic dysfunction present.  Normal RV size and function.  Severe left atrial enlargement.  Trivial aortic regurgitation.  Mild dilation of the aortic root (4.1 cm) and ascending aorta (4.0 cm).  Normal CVP. Coronary CTA (01/19/2022): Coronary calcium score 308 (86th percentile).  Normal coronary origins with left dominant system.  Mild (25-49%) calcified plaque in the proximal LAD and LCx.  Mild dilation of the ascending aorta, measuring up to 3.9 cm. CTA chest (03/28/2021): Minor fusiform aneurysmal dilation of the ascending aorta, measuring up to 4.0 cm.  Aortic atherosclerosis and coronary artery calcification noted. TTE (09/05/2019): Normal LV size with severe LVH.  LVEF greater than 75% with grade 2 diastolic dysfunction.  Left atrial pressure elevated.  Normal RV size and function.  Mild left atrial enlargement.  No significant valvular abnormality.  Mild dilation of ascending aorta noted, measuring 4.1 cm. Cardiac MRI (06/20/2019): Asymmetric LV hypertrophy measuring 2.0 cm in basal anteroseptum and 1.0 cm and posterior wall consistent with hypertrophic cardiomyopathy.  There is also significant  apical hypertrophy measuring up to 1.8 cm.  Resting LVOT gradient of 16 mmHg.  Patchy late gadolinium enhancement noted.  LVEF 68%. TTE  (05/11/19): Normal LV size with moderate to severe, asymmetric left ventricular hypertrophy.  LVEF 60-65% with grade 1 diastolic dysfunction.  Mild resting LVOT gradient noted (~15 mmHg).  Normal RV size and function.  Moderate left atrial enlargement.  Mild aortic root dilation (4.0 cm).  Recent CV Pertinent Labs: Lab Results  Component Value Date   CHOL 131 07/25/2019   HDL 38 (L) 07/25/2019   LDLCALC 71 07/25/2019   TRIG 111 07/25/2019   CHOLHDL 3.4 07/25/2019   INR 1.0 03/29/2019   K 4.1 03/12/2021   K 3.6 06/25/2012   BUN 21 (H) 03/12/2021   BUN 17 06/25/2012   CREATININE 1.20 01/19/2022   CREATININE 1.33 (H) 06/25/2012    Past medical and surgical history were reviewed and updated in EPIC.  Current Meds  Medication Sig   amLODipine (NORVASC) 10 MG tablet TAKE 1 TABLET BY MOUTH EVERY DAY   aspirin EC 81 MG tablet Take 81 mg by mouth daily.   atorvastatin (LIPITOR) 20 MG tablet TAKE 1 TABLET BY MOUTH EVERY DAY   baclofen (LIORESAL) 10 MG tablet Take 10 mg by mouth 3 (three) times daily.   colchicine 0.6 MG tablet Take 1 tablet by mouth daily as needed.    gabapentin (NEURONTIN) 100 MG capsule Take 100 mg by mouth in the morning and at bedtime.   losartan (COZAAR) 100 MG tablet Take 100 mg by mouth daily.   metoprolol succinate (TOPROL-XL) 50 MG 24 hr tablet TAKE 1 TABLET BY MOUTH EVERY DAY   sildenafil (REVATIO) 20 MG tablet Take 20 mg by mouth as needed.    Allergies: Patient has no known allergies.  Social History   Tobacco Use   Smoking status: Never   Smokeless tobacco: Never  Vaping Use   Vaping Use: Never used  Substance Use Topics   Alcohol use: No   Drug use: No    Family History  Problem Relation Age of Onset   Heart attack Father 38   Heart attack Nephew 33   Heart failure Neg Hx    Sudden Cardiac Death Neg Hx     Review of Systems: A 12-system review of systems was performed and was negative except as noted in the  HPI.  --------------------------------------------------------------------------------------------------  Physical Exam: BP 137/74 (BP Location: Left Arm, Patient Position: Sitting, Cuff Size: Large)   Pulse 61   Ht 5\' 8"  (1.727 m)   Wt 218 lb (98.9 kg)   SpO2 98%   BMI 33.15 kg/m  Position Blood pressure (mmHg) Heart rate (bpm)  Lying 130/73 59  Sitting 147/75 60  Standing 143/78 62  Standing (3 minutes) 149/81 62   General:  NAD. Neck: No JVD or HJR. Lungs: Clear to auscultation bilaterally without wheezes or crackles. Heart: Regular rate and rhythm with 1/6 systolic murmur.  No rubs or gallops. Abdomen: Soft, nontender, nondistended. Extremities: No lower extremity edema.  EKG: Normal sinus rhythm with PACs, left atrial enlargement, and LVH with abnormal repolarization.  No significant change from prior tracing on 01/07/2022.  Lab Results  Component Value Date   WBC 7.2 03/29/2019   HGB 15.3 03/29/2019   HCT 45.6 03/29/2019   MCV 90.3 03/29/2019   PLT 231 03/29/2019    Lab Results  Component Value Date   NA 137 03/12/2021   K 4.1 03/12/2021   CL 108  03/12/2021   CO2 25 03/12/2021   BUN 21 (H) 03/12/2021   CREATININE 1.20 01/19/2022   GLUCOSE 98 03/12/2021   ALT 15 07/25/2019    Lab Results  Component Value Date   CHOL 131 07/25/2019   HDL 38 (L) 07/25/2019   LDLCALC 71 07/25/2019   TRIG 111 07/25/2019   CHOLHDL 3.4 07/25/2019    --------------------------------------------------------------------------------------------------  ASSESSMENT AND PLAN: Hypertrophic cardiomyopathy: Mr. Hollerbach has noticed some increased fatigue with exertion as well as orthostatic lightheadedness.  His orthostatic vital signs today do not show a blood pressure drop or rise in heart rate.  Though he appears euvolemic on exam, reports of new orthopnea suggest some degree of elevated filling pressure.  Grade 2 diastolic dysfunction was noted on his recent echo.  We have agreed to  add furosemide 20 mg daily, though I have cautioned Mr. Ferrando that dehydration could exacerbate some effects of his hypertrophic cardiomyopathy such as lightheadedness/hypotension.  BMP will be checked at his follow-up visit in ~3 weeks.  I am reluctant to further escalate his metoprolol dose today, given a resting heart rate in the upper 50s and low 60s.  If he does not have any symptomatic improvement with gentle diuresis, we will consider referral to our hypertrophic cardiomyopathy team in [facility] for further evaluation/management.  Hypertension: Blood pressure upper normal to mildly elevated.  As above, we will add a low-dose furosemide and continue current regimen of amlodipine, losartan, and metoprolol.  Hyperlipidemia and coronary artery disease: No recent lipid panel in our system.  We will request most recent labs from his PCP.  Given nonobstructive CAD on recent coronary CTA, it is reasonable to continue aspirin and atorvastatin 20 mg daily.  Dilated thoracic aorta: Borderline enlargement of the ascending aorta noted on coronary CTA in October.  Continue blood pressure control as well as aspirin and statin therapy.  Follow-up: Return to clinic in 3 weeks with BMP at that time.  Yvonne Kendall, MD 03/12/2022 10:13 AM

## 2022-03-12 NOTE — Patient Instructions (Signed)
Medication Instructions:  START: Lasix 20 mg by mouth daily  *If you need a refill on your cardiac medications before your next appointment, please call your pharmacy*   Lab Work: None ordered today   Testing/Procedures: None ordered today   Follow-Up: At [facility], you and your health needs are our priority.  As part of our continuing mission to provide you with exceptional heart care, we have created designated Provider Care Teams.  These Care Teams include your primary Cardiologist (physician) and Advanced Practice Providers (APPs -  Physician Assistants and Nurse Practitioners) who all work together to provide you with the care you need, when you need it.  We recommend signing up for the patient portal called "MyChart".  Sign up information is provided on this After Visit Summary.  MyChart is used to connect with patients for Virtual Visits (Telemedicine).  Patients are able to view lab/test results, encounter notes, upcoming appointments, etc.  Non-urgent messages can be sent to your provider as well.   To learn more about what you can do with MyChart, go to [email or web address].    Your next appointment:   3 week(s)  The format for your next appointment:   In Person  Provider:   You may see Yvonne Kendall, MD or one of the following Advanced Practice Providers on your designated Care Team:   Nicolasa Ducking, NP Eula Listen, PA-C Cadence Fransico Michael, PA-C Charlsie Quest, NP

## 2022-03-13 ENCOUNTER — Encounter: Payer: Self-pay | Admitting: Internal Medicine

## 2022-03-13 DIAGNOSIS — I77810 Thoracic aortic ectasia: Secondary | ICD-10-CM | POA: Insufficient documentation

## 2022-04-02 ENCOUNTER — Ambulatory Visit: Payer: BC Managed Care – PPO | Admitting: Internal Medicine

## 2022-04-02 NOTE — Progress Notes (Deleted)
Follow-up Outpatient Visit Date: 04/02/2022  Primary Care Provider: [facility] [address] [place] [address]  Chief Complaint: ***  HPI:  Brady Johnson is a 60 y.o. male with history of hypertrophic cardiomyopathy (previously seen by genetics and EP), hypertension, hyperlipidemia, obstructive sleep apnea, and gout, who presents for follow-up of hypertrophic cardiomyopathy.  I last saw Brady Johnson about 3 weeks ago, at which time he complained of increased fatigue and orthostatic lightheadedness (orthostatic vital signs were negative in the office) we agreed to add furosemide 20 mg daily.  Check BMP  --------------------------------------------------------------------------------------------------  Cardiovascular History & Procedures: Cardiovascular Problems: Hypertrophic cardiomyopathy Dilated ascending aorta   Risk Factors: Hypertension, hyperlipidemia, obesity, male gender, family history and age > 19   Cath/PCI: LHC (03/29/2019): No significant coronary artery disease.  LVEF 60%.   CV Surgery: None   EP Procedures and Devices: Event monitor (07/18/2019): Predominantly sinus rhythm with occasional PACs and rare PVCs.  Single episode of NSVT lasting 7 beats was observed, as well as atrial runs lasting up to 12 beats.   Non-Invasive Evaluation(s): TTE (02/04/2022): Asymmetric septal hypertrophy measuring up to 1.8 cm, consistent with history of HCM.  No LVOT obstruction noted.  LVEF 65-70% with normal wall motion.  Grade 2 diastolic dysfunction present.  Normal RV size and function.  Severe left atrial enlargement.  Trivial aortic regurgitation.  Mild dilation of the aortic root (4.1 cm) and ascending aorta (4.0 cm).  Normal CVP. Coronary CTA (01/19/2022): Coronary calcium score 308 (86th percentile).  Normal coronary origins with left dominant system.  Mild (25-49%) calcified plaque in the proximal LAD and LCx.  Mild dilation of the ascending aorta,  measuring up to 3.9 cm. CTA chest (03/28/2021): Minor fusiform aneurysmal dilation of the ascending aorta, measuring up to 4.0 cm.  Aortic atherosclerosis and coronary artery calcification noted. TTE (09/05/2019): Normal LV size with severe LVH.  LVEF greater than 75% with grade 2 diastolic dysfunction.  Left atrial pressure elevated.  Normal RV size and function.  Mild left atrial enlargement.  No significant valvular abnormality.  Mild dilation of ascending aorta noted, measuring 4.1 cm. Cardiac MRI (06/20/2019): Asymmetric LV hypertrophy measuring 2.0 cm in basal anteroseptum and 1.0 cm and posterior wall consistent with hypertrophic cardiomyopathy.  There is also significant apical hypertrophy measuring up to 1.8 cm.  Resting LVOT gradient of 16 mmHg.  Patchy late gadolinium enhancement noted.  LVEF 68%. TTE (05/11/19): Normal LV size with moderate to severe, asymmetric left ventricular hypertrophy.  LVEF 60-65% with grade 1 diastolic dysfunction.  Mild resting LVOT gradient noted (~15 mmHg).  Normal RV size and function.  Moderate left atrial enlargement.  Mild aortic root dilation (4.0 cm).   Recent CV Pertinent Labs: Lab Results  Component Value Date   CHOL 131 07/25/2019   HDL 38 (L) 07/25/2019   LDLCALC 71 07/25/2019   TRIG 111 07/25/2019   CHOLHDL 3.4 07/25/2019   INR 1.0 03/29/2019   K 4.1 03/12/2021   K 3.6 06/25/2012   BUN 21 (H) 03/12/2021   BUN 17 06/25/2012   CREATININE 1.20 01/19/2022   CREATININE 1.33 (H) 06/25/2012    Past medical and surgical history were reviewed and updated in EPIC.  No outpatient medications have been marked as taking for the 04/02/22 encounter (Appointment) with Aubria Vanecek, Cristal Deer, MD.    Allergies: Patient has no known allergies.  Social History   Tobacco Use   Smoking status: Never   Smokeless tobacco: Never  Vaping Use   Vaping Use: Never used  Substance Use Topics   Alcohol use: No   Drug use: No    Family History  Problem Relation Age  of Onset   Heart attack Father 32   Heart attack Nephew 33   Heart failure Neg Hx    Sudden Cardiac Death Neg Hx     Review of Systems: A 12-system review of systems was performed and was negative except as noted in the HPI.  --------------------------------------------------------------------------------------------------  Physical Exam: There were no vitals taken for this visit.  General:  NAD. Neck: No JVD or HJR. Lungs: Clear to auscultation bilaterally without wheezes or crackles. Heart: Regular rate and rhythm without murmurs, rubs, or gallops. Abdomen: Soft, nontender, nondistended. Extremities: No lower extremity edema.  EKG:  ***  Lab Results  Component Value Date   WBC 7.2 03/29/2019   HGB 15.3 03/29/2019   HCT 45.6 03/29/2019   MCV 90.3 03/29/2019   PLT 231 03/29/2019    Lab Results  Component Value Date   NA 137 03/12/2021   K 4.1 03/12/2021   CL 108 03/12/2021   CO2 25 03/12/2021   BUN 21 (H) 03/12/2021   CREATININE 1.20 01/19/2022   GLUCOSE 98 03/12/2021   ALT 15 07/25/2019    Lab Results  Component Value Date   CHOL 131 07/25/2019   HDL 38 (L) 07/25/2019   LDLCALC 71 07/25/2019   TRIG 111 07/25/2019   CHOLHDL 3.4 07/25/2019    --------------------------------------------------------------------------------------------------  ASSESSMENT AND PLAN: Cristal Deer Emmanuella Mirante, MD 04/02/2022 7:32 AM

## 2022-05-13 ENCOUNTER — Telehealth: Payer: BC Managed Care – PPO | Admitting: Physician Assistant

## 2022-05-13 DIAGNOSIS — H1032 Unspecified acute conjunctivitis, left eye: Secondary | ICD-10-CM

## 2022-05-13 MED ORDER — POLYMYXIN B-TRIMETHOPRIM 10000-0.1 UNIT/ML-% OP SOLN: OPHTHALMIC | 0 refills | Status: DC

## 2022-05-13 NOTE — Progress Notes (Signed)

## 2022-05-13 NOTE — Progress Notes (Signed)
I have spent 5 minutes in review of e-visit questionnaire, review and updating patient chart, medical decision making and response to patient.   Veronique Warga Cody Allani Reber, PA-C    

## 2022-05-13 NOTE — Progress Notes (Signed)
Message sent to patient requesting further input regarding current symptoms. Awaiting patient response.  

## 2022-05-21 ENCOUNTER — Ambulatory Visit: Payer: BC Managed Care – PPO | Admitting: Internal Medicine

## 2022-05-23 ENCOUNTER — Other Ambulatory Visit: Payer: Self-pay | Admitting: Internal Medicine

## 2022-05-23 DIAGNOSIS — I422 Other hypertrophic cardiomyopathy: Secondary | ICD-10-CM

## 2022-05-26 ENCOUNTER — Encounter: Payer: Self-pay | Admitting: *Deleted

## 2022-05-26 DIAGNOSIS — Z006 Encounter for examination for normal comparison and control in clinical research program: Secondary | ICD-10-CM

## 2022-05-26 NOTE — Research (Addendum)
Message left for Brady Johnson about V1P research.     Brady Johnson called back and states ok to send information about V1P to him emailed him a copy of the consent to read over and encouraged to call with any questions.

## 2022-05-29 ENCOUNTER — Encounter: Payer: Self-pay | Admitting: *Deleted

## 2022-05-29 DIAGNOSIS — Z006 Encounter for examination for normal comparison and control in clinical research program: Secondary | ICD-10-CM

## 2022-05-29 NOTE — Research (Signed)
Message left to see if Mr Filkins had any questions about V1P or if he would like to come in for screening.

## 2022-06-03 ENCOUNTER — Encounter: Payer: Self-pay | Admitting: *Deleted

## 2022-06-03 DIAGNOSIS — Z006 Encounter for examination for normal comparison and control in clinical research program: Secondary | ICD-10-CM

## 2022-06-03 NOTE — Research (Signed)
Brady Johnson called and states that he would like to come in for Screening. Scheduled him for March 14 at 0900.

## 2022-06-03 NOTE — Progress Notes (Unsigned)
Follow-up Outpatient Visit Date: 06/04/2022  Primary Care Provider: [facility] [place] [id number]  Chief Complaint: Follow-up hypertrophic cardiomyopathy and hypertension  HPI:  Brady Johnson is a 62 y.o. male with history of hypertrophic cardiomyopathy (previously seen by genetics and EP), hypertension, hyperlipidemia, obstructive sleep apnea, and gout, who presents for follow-up of hypertrophic cardiomyopathy.  I last saw him in November, at which time he complained of increased fatigue and dizziness.  I was concerned that [name] may be retaining fluid in the setting of diastolic dysfunction.  We agreed to add furosemide 20 mg daily.  Today, [name] reports that he feels "pretty good."  He feels like his "body weight" has improved with addition of furosemide.  He has not had any significant swelling.  He denies chest pain, palpitations, lightheadedness, and syncope.  He still has some exertional dyspnea when doing strenuous activities.  He is also concerned about some occasional memory issues and inquires about taking an over-the-counter supplement that he saw advertised on television to help with this.  --------------------------------------------------------------------------------------------------  Cardiovascular History & Procedures: Cardiovascular Problems: Hypertrophic cardiomyopathy Dilated ascending aorta   Risk Factors: Hypertension, hyperlipidemia, obesity, male gender, family history and age > 57   Cath/PCI: LHC (03/29/2019): No significant coronary artery disease.  LVEF 60%.   CV Surgery: None   EP Procedures and Devices: Event monitor (07/18/2019): Predominantly sinus rhythm with occasional PACs and rare PVCs.  Single episode of NSVT lasting 7 beats was observed, as well as atrial runs lasting up to 12 beats.   Non-Invasive Evaluation(s): TTE (02/04/2022): Asymmetric septal hypertrophy measuring up to 1.8 cm, consistent  with history of HCM.  No LVOT obstruction noted.  LVEF 65-70% with normal wall motion.  Grade 2 diastolic dysfunction present.  Normal RV size and function.  Severe left atrial enlargement.  Trivial aortic regurgitation.  Mild dilation of the aortic root (4.1 cm) and ascending aorta (4.0 cm).  Normal CVP. Coronary CTA (01/19/2022): Coronary calcium score 308 (86th percentile).  Normal coronary origins with left dominant system.  Mild (25-49%) calcified plaque in the proximal LAD and LCx.  Mild dilation of the ascending aorta, measuring up to 3.9 cm. CTA chest (03/28/2021): Minor fusiform aneurysmal dilation of the ascending aorta, measuring up to 4.0 cm.  Aortic atherosclerosis and coronary artery calcification noted. TTE (09/05/2019): Normal LV size with severe LVH.  LVEF greater than 75% with grade 2 diastolic dysfunction.  Left atrial pressure elevated.  Normal RV size and function.  Mild left atrial enlargement.  No significant valvular abnormality.  Mild dilation of ascending aorta noted, measuring 4.1 cm. Cardiac MRI (06/20/2019): Asymmetric LV hypertrophy measuring 2.0 cm in basal anteroseptum and 1.0 cm and posterior wall consistent with hypertrophic cardiomyopathy.  There is also significant apical hypertrophy measuring up to 1.8 cm.  Resting LVOT gradient of 16 mmHg.  Patchy late gadolinium enhancement noted.  LVEF 68%. TTE (05/11/19): Normal LV size with moderate to severe, asymmetric left ventricular hypertrophy.  LVEF 60-65% with grade 1 diastolic dysfunction.  Mild resting LVOT gradient noted (~15 mmHg).  Normal RV size and function.  Moderate left atrial enlargement.  Mild aortic root dilation (4.0 cm).  Recent CV Pertinent Labs: Lab Results  Component Value Date   CHOL 131 07/25/2019   HDL 38 (L) 07/25/2019   LDLCALC 71 07/25/2019   TRIG 111 07/25/2019   CHOLHDL 3.4 07/25/2019   INR 1.0 03/29/2019   K 4.1 03/12/2021  K 3.6 06/25/2012   BUN 21 (H) 03/12/2021   BUN 17 06/25/2012    CREATININE 1.20 01/19/2022   CREATININE 1.33 (H) 06/25/2012    Past medical and surgical history were reviewed and updated in EPIC.  Current Meds  Medication Sig   aspirin EC 81 MG tablet Take 81 mg by mouth daily.   atorvastatin (LIPITOR) 20 MG tablet TAKE 1 TABLET BY MOUTH EVERY DAY   baclofen (LIORESAL) 10 MG tablet Take 10 mg by mouth 3 (three) times daily.   colchicine 0.6 MG tablet Take 1 tablet by mouth daily as needed.    furosemide (LASIX) 20 MG tablet Take 1 tablet (20 mg total) by mouth daily.   losartan (COZAAR) 100 MG tablet Take 100 mg by mouth daily.   meloxicam (MOBIC) 15 MG tablet Take 15 mg by mouth daily.   metoprolol succinate (TOPROL-XL) 50 MG 24 hr tablet TAKE 1 TABLET BY MOUTH EVERY DAY   sildenafil (REVATIO) 20 MG tablet Take 20 mg by mouth as needed.   trimethoprim-polymyxin b (POLYTRIM) ophthalmic solution Apply 1-2 drops into affected eye QID x 5 days.   [DISCONTINUED] amLODipine (NORVASC) 10 MG tablet TAKE 1 TABLET BY MOUTH EVERY DAY    Allergies: Patient has no known allergies.  Social History   Tobacco Use   Smoking status: Never   Smokeless tobacco: Never  Vaping Use   Vaping Use: Never used  Substance Use Topics   Alcohol use: No   Drug use: No    Family History  Problem Relation Age of Onset   Heart attack Father 62   Heart attack Nephew 33   Heart failure Neg Hx    Sudden Cardiac Death Neg Hx     Review of Systems: A 12-system review of systems was performed and was negative except as noted in the HPI.  --------------------------------------------------------------------------------------------------  Physical Exam: BP 130/70 (BP Location: Left Arm, Patient Position: Sitting, Cuff Size: Large)   Pulse 62   Ht 5' 7"$  (1.702 m)   Wt 220 lb (99.8 kg)   SpO2 98%   BMI 34.46 kg/m   General:  NAD. Neck: No JVD or HJR. Lungs: Clear to auscultation bilaterally without wheezes or crackles. Heart: Regular rate and rhythm with 2/6  systolic murmur. Abdomen: Soft, nontender, nondistended. Extremities: No lower extremity edema.  EKG: Normal sinus rhythm with sinus arrhythmia, left atrial enlargement, and LVH with abnormal repolarization.  Lab Results  Component Value Date   WBC 7.2 03/29/2019   HGB 15.3 03/29/2019   HCT 45.6 03/29/2019   MCV 90.3 03/29/2019   PLT 231 03/29/2019    Lab Results  Component Value Date   NA 137 03/12/2021   K 4.1 03/12/2021   CL 108 03/12/2021   CO2 25 03/12/2021   BUN 21 (H) 03/12/2021   CREATININE 1.20 01/19/2022   GLUCOSE 98 03/12/2021   ALT 15 07/25/2019    Lab Results  Component Value Date   CHOL 131 07/25/2019   HDL 38 (L) 07/25/2019   LDLCALC 71 07/25/2019   TRIG 111 07/25/2019   CHOLHDL 3.4 07/25/2019    --------------------------------------------------------------------------------------------------  ASSESSMENT AND PLAN: Hypertrophic cardiomyopathy: Brady Johnson seems to be feeling a little better with addition of low-dose furosemide at our last visit.  I will check a BMP today to ensure stable renal function and potassium.  We will continue his current regimen of metoprolol and furosemide.  Mr. Kolk is planning to participate in a hypertrophic cardiomyopathy research study.  We discussed referral to Dr. Gasper Sells but have agreed to defer this for now, given his mild and stable symptoms.  Coronary artery disease: Mild, nonobstructive CAD noted on coronary CTA in 01/2022.  Continue aspirin and statin therapy.  Encouraged lifestyle modifications to help improve lipid profile.  Hypertension: Blood pressure upper normal today.  No medication changes at this time.  Hyperlipidemia: LDL mildly elevated on last check through PCP in 02/2022 (105).  Continue atorvastatin 20 mg daily.  Lifestyle modifications encouraged.  Dilated thoracic aorta: Mild dilation of aortic root and ascending aorta noted by CTA.  Plan for repeat CTA chest around 01/2023.  Follow-up:  Return to clinic in 6 months.  Nelva Bush, MD 06/04/2022 9:21 AM

## 2022-06-04 ENCOUNTER — Other Ambulatory Visit: Payer: Self-pay | Admitting: Physician Assistant

## 2022-06-04 ENCOUNTER — Encounter: Payer: Self-pay | Admitting: Internal Medicine

## 2022-06-04 ENCOUNTER — Ambulatory Visit: Payer: BC Managed Care – PPO | Attending: Internal Medicine | Admitting: Internal Medicine

## 2022-06-04 ENCOUNTER — Other Ambulatory Visit
Admission: RE | Admit: 2022-06-04 | Discharge: 2022-06-04 | Disposition: A | Discharge status: Home / Self Care | Payer: BC Managed Care – PPO | Source: Ambulatory Visit | Attending: Internal Medicine | Admitting: Internal Medicine

## 2022-06-04 VITALS — BP 130/70 | HR 62 | Ht 67.0 in | Wt 220.0 lb

## 2022-06-04 DIAGNOSIS — E782 Mixed hyperlipidemia: Secondary | ICD-10-CM

## 2022-06-04 DIAGNOSIS — I422 Other hypertrophic cardiomyopathy: Secondary | ICD-10-CM | POA: Diagnosis not present

## 2022-06-04 DIAGNOSIS — Z79899 Other long term (current) drug therapy: Secondary | ICD-10-CM | POA: Diagnosis not present

## 2022-06-04 DIAGNOSIS — I10 Essential (primary) hypertension: Secondary | ICD-10-CM

## 2022-06-04 DIAGNOSIS — I2510 Atherosclerotic heart disease of native coronary artery without angina pectoris: Secondary | ICD-10-CM

## 2022-06-04 DIAGNOSIS — I77810 Thoracic aortic ectasia: Secondary | ICD-10-CM

## 2022-06-04 LAB — BASIC METABOLIC PANEL
Anion gap: 8 (ref 5–15)
BUN: 17 mg/dL (ref 6–20)
CO2: 25 mmol/L (ref 22–32)
Calcium: 9.5 mg/dL (ref 8.9–10.3)
Chloride: 108 mmol/L (ref 98–111)
Creatinine, Ser: 1.34 mg/dL — ABNORMAL HIGH (ref 0.61–1.24)
GFR, Estimated: >60 mL/min (ref >60)
Glucose, Bld: 99 mg/dL (ref 70–99)
Potassium: 4.1 mmol/L (ref 3.5–5.1)
Sodium: 141 mmol/L (ref 135–145)

## 2022-06-04 NOTE — Patient Instructions (Addendum)
Medication Instructions:  Your Physician recommend you continue on your current medication as directed.    *If you need a refill on your cardiac medications before your next appointment, please call your pharmacy*   Lab Work: Your provider would like for you to have following labs drawn: (BMP).   Please go to the [facility] entrance and check in at the front desk.  You do not need an appointment.  They are open from 7am-6 pm.     Testing/Procedures: None ordered today   Follow-Up: At [facility], you and your health needs are our priority.  As part of our continuing mission to provide you with exceptional heart care, we have created designated Provider Care Teams.  These Care Teams include your primary Cardiologist (physician) and Advanced Practice Providers (APPs -  Physician Assistants and Nurse Practitioners) who all work together to provide you with the care you need, when you need it.  We recommend signing up for the patient portal called "MyChart".  Sign up information is provided on this After Visit Summary.  MyChart is used to connect with patients for Virtual Visits (Telemedicine).  Patients are able to view lab/test results, encounter notes, upcoming appointments, etc.  Non-urgent messages can be sent to your provider as well.   To learn more about what you can do with MyChart, go to [email or web address].    Your next appointment:   6 month(s)  Provider:   You may see Nelva Bush, MD or one of the following Advanced Practice Providers on your designated Care Team:   Murray Hodgkins, NP Christell Faith, PA-C Cadence Kathlen Mody, PA-C Gerrie Nordmann, NP

## 2022-06-04 NOTE — Telephone Encounter (Signed)
Good Morning Brandy,  This patient has an appointment today with Dr. Saunders Revel at 9:20. Could you see if this refill is appropriate at the time of visit? Thank you so much.

## 2022-06-20 ENCOUNTER — Other Ambulatory Visit: Payer: Self-pay | Admitting: Internal Medicine

## 2022-06-20 DIAGNOSIS — I422 Other hypertrophic cardiomyopathy: Secondary | ICD-10-CM

## 2022-06-25 ENCOUNTER — Encounter: Payer: BC Managed Care – PPO | Admitting: *Deleted

## 2022-06-25 VITALS — BP 127/74 | HR 54 | Temp 97.9°F | Resp 18 | Ht 67.0 in | Wt 213.0 lb

## 2022-06-25 DIAGNOSIS — Z006 Encounter for examination for normal comparison and control in clinical research program: Secondary | ICD-10-CM

## 2022-06-25 NOTE — Progress Notes (Signed)
Patient was seen as a part of V1P research study. Doing well, no complaints.   General: Well developed, well nourished, male appearing in no acute distress. Head: Normocephalic, atraumatic.  Neck: Supple without bruits, JVD. Lungs:  Resp regular and unlabored, CTA. Heart: RRR, S1, S2, no S3, S4, or murmur; no rub. Abdomen: Soft, non-tender, non-distended. Extremities: No clubbing, cyanosis, edema. Neuro: Alert and oriented X 3. Moves all extremities spontaneously. Psych: Normal affect.  SignedReino Bellis, NP-C 06/25/2022, 9:34 AM

## 2022-06-25 NOTE — Research (Addendum)
V1P Informed Consent   Subject Name: Brady Johnson  Subject met inclusion and exclusion criteria.  The informed consent form, study requirements and expectations were reviewed with the subject and questions and concerns were addressed prior to the signing of the consent form.  The subject verbalized understanding of the trial requirements.  The subject agreed to participate in the V1P trial and signed the informed consent on 06/25/2022.  The informed consent was obtained prior to performance of any protocol-specific procedures for the subject.  A copy of the signed informed consent was given to the subject and a copy was placed in the subject's medical record.   [name]    Inclusion  YES NO   1-Written informed consent must be obtained before any assessment is performed   '[x]'$  '[]'$    2-Male or male ?72 but <34 years of age   '[x]'$  '[]'$    3-At an increased risk for a first MACE (i.e., no prior major ASCVD event), defined as any one of the following       a. Evidence of atherosclerotic coronary artery disease (CAD) on computer tomography (CT) or invasive coronary angiogram defined as a coronary artery stenosis ?20% but <50% in the left main coronary artery or stenosis ?20% but <70% in any other major epicardial coronary artery, or   '[]'$     b. Coronary artery calcium (CAC) score obtained by CT-scan ?100 Agatston units, or   '[x]'$  _308__    c. High 10-year ASCVD risk ?20%, or   '[]'$     d. Intermediate 10-year ASCVD risk 7.5% - <20% with at least 2 risk-enhancing factors   '[]'$     If on a background LLT, the dose should be stable for at least 4 weeks prior to the screening visit and the participant should be willing to remain on this background therapy for the entire duration of the study.   '[x]'$  '[]'$    LDL-C ?70 mg/dL (?1.81 mmol/L) but <190 mg/dL (<4.91 mmol/L) at the screening visit.   '[x]'$  '[]'$     Exclusion     History of major ASCVD event defined as any one of the following    '[]'$  '[x]'$    a. Acute coronary syndrome (ACS) in the 12 months prior to randomization, or   '[]'$  '[x]'$    b. Prior myocardial infarction at any time prior to randomization   '[]'$  '[x]'$    c. Prior ischemic stroke at any time prior to randomization   '[]'$  '[x]'$    d. Symptomatic peripheral artery disease (PAD) as evidenced by either intermittent claudication, previous revascularization, or amputation due to atherosclerotic disease at any time prior to randomization.   '[]'$  '[x]'$    History of, or planned, ischemia-driven revascularization in a coronary or extracoronary arterial bed prior to randomization   '[]'$  '[x]'$    Absence of coronary atherosclerosis on a CT angiogram or an invasive coronary angiogram in the 2 years prior to randomization   '[]'$  '[x]'$    Coronary artery calcium (CAC) score of 0 obtained in the 2 years prior to randomization   '[]'$  '[x]'$    Active liver disease or hepatic dysfunction   '[]'$  '[x]'$    Previous, current, or planned treatment with a monoclonal antibody (mAb) directed toward proprotein convertase subtilisin/kexin type 9 (PCSK9) (e.g., evolocumab, alirocumab)   '[]'$  '[x]'$    Pregnant or nursing (lactating) women   '[]'$  '[x]'$    Women of childbearing potential unless they are using effective methods of contraception while taking study treatment which includes for 6 months after  last study drug administration   []  [x]    VP1 Screening Visit  Subject number: [id number] Informed consent signed [x]   ICF signed on 06/25/2022  Inclusion and exclusion criteria reviewed [x]  Patient demography reviewed [x]   Past and current medical history reviewed [x]  Surgical history reviewed [x]  Prior or concomitant medications reviewed [x]  Lipid lowering medications reviewed [x]     Physical Exam performed by PI or SUB-I [x]   LR   Vital signs: Vitals:   06/25/22 0916 06/25/22 0918 06/25/22 0920  BP: 123/65 127/67 127/74  Pulse: (!) 54 (!) 56 (!) 54  Temp: 97.9 F (36.6 C)    Resp: 18    Height: 5\' 7"   (1.702 m)    Weight: 213 lb (96.6 kg)    SpO2: 97% 96%   TempSrc: Temporal    BMI (Calculated): 33.35      Waist circumference: 91 cm  Mean BP: 126/69   Mean Pulse: 55  Labs collected at: 0920 Serum pregnancy [x]  na Fasting Lipid Profile [x]  Broad Clinical Chemistry [x]  Hematology panel [x]  Urinalysis [x]   Genetic Consent obtained [x]   AE/SAE assessment completed [x]    Current Outpatient Medications:    amLODipine (NORVASC) 10 MG tablet, TAKE 1 TABLET BY MOUTH EVERY DAY, Disp: 90 tablet, Rfl: 0   aspirin EC 81 MG tablet, Take 81 mg by mouth daily., Disp: , Rfl:    atorvastatin (LIPITOR) 20 MG tablet, TAKE 1 TABLET BY MOUTH EVERY DAY, Disp: 90 tablet, Rfl: 0   baclofen (LIORESAL) 10 MG tablet, Take 10 mg by mouth 3 (three) times daily., Disp: , Rfl:    colchicine 0.6 MG tablet, Take 1 tablet by mouth daily as needed. , Disp: , Rfl:    furosemide (LASIX) 20 MG tablet, Take 1 tablet (20 mg total) by mouth daily., Disp: 90 tablet, Rfl: 3   losartan (COZAAR) 100 MG tablet, Take 100 mg by mouth daily., Disp: , Rfl:    meloxicam (MOBIC) 15 MG tablet, Take 15 mg by mouth daily., Disp: , Rfl:    metoprolol succinate (TOPROL-XL) 50 MG 24 hr tablet, TAKE 1 TABLET BY MOUTH EVERY DAY, Disp: 90 tablet, Rfl: 1   sildenafil (REVATIO) 20 MG tablet, Take 20 mg by mouth as needed., Disp: , Rfl:

## 2022-06-29 NOTE — Research (Addendum)
Are there any labs that are clinically significant?  Yes [] OR No[x]  Is the patient eligible to continue enrollment in the study after screening visit?  Yes [x]  OR No[]          

## 2022-06-30 ENCOUNTER — Encounter: Payer: Self-pay | Admitting: *Deleted

## 2022-06-30 DIAGNOSIS — Z006 Encounter for examination for normal comparison and control in clinical research program: Secondary | ICD-10-CM

## 2022-06-30 NOTE — Research (Signed)
Spoke with Brady Johnson informed him that he can continue in V1p research study. Scheduled him for March 25 at 0900. Voices understanding.

## 2022-06-30 NOTE — Research (Signed)
Message left for Brady Johnson to schedule his next appointment for V1P. Encouraged him to call me back.

## 2022-07-03 ENCOUNTER — Encounter: Payer: Self-pay | Admitting: *Deleted

## 2022-07-03 DIAGNOSIS — Z006 Encounter for examination for normal comparison and control in clinical research program: Secondary | ICD-10-CM

## 2022-07-03 NOTE — Research (Signed)
Text sent to Brady Johnson to remind him of his appointment Monday March 25 at 0900, reminded to be NPO and gave him the parking code.

## 2022-07-06 ENCOUNTER — Encounter: Payer: BC Managed Care – PPO | Admitting: *Deleted

## 2022-07-06 ENCOUNTER — Other Ambulatory Visit: Payer: Self-pay

## 2022-07-06 VITALS — BP 137/89 | HR 60 | Temp 97.6°F | Resp 16 | Wt 212.0 lb

## 2022-07-06 DIAGNOSIS — Z006 Encounter for examination for normal comparison and control in clinical research program: Secondary | ICD-10-CM

## 2022-07-06 MED ORDER — STUDY - VICTORION-1 PREVENT - INCLISIRAN 300 MG/1.5 ML OR PLACEBO SQ INJECTION (PI-STUCKEY)
300.0000 mg | INJECTION | SUBCUTANEOUS | Status: DC
  Administered 2022-07-06: 300 mg via SUBCUTANEOUS
  Filled 2022-07-06: qty 1.5

## 2022-07-06 NOTE — Research (Signed)
[facility] 1 Prevent Baseline Visit VIDEL NAGER 1960-09-30 [id number]  Date: 07/06/2022  Subject ID: [id number] Kit #: [id number]  Inclusion and exclusion criteria reviewed [x]   Prior or concomitant medications reviewed [x]   Lipid lowering medications reviewed [x]   Surgical history reviewed [x]    TIME: BP: PULSE:  0855 135/75 54  0858 126/78 54  0901 137/89 60   Mean BP:134/80   Mean Pulse: 56 Weight: 212 bs./ 96.1 kg  Lifestyle instructions reviewed [x]   Labs collected at: 0905   Fasting Lipid Profile [x]  Fasting Lp(a) []  Liver function tests [x]  hsCRP [x]  Genetic testing [x]  Exploratory biomarkers [x]   AE/SAE assessment completed [x]   Randomization completed [x]   Study drug administered [x]   V1P Study Drug Administration  Subject ID #: [id number] Kit #: [id number] NDC: [phone] Administration Site: Right lower abd  Scheduled next visit for Jun 25 at 0900. Observed patient for greater than 30 minutes. No problems noted.

## 2022-07-08 NOTE — Research (Addendum)
Updated to correct LFTs noted on prior version of note  Are there any labs that are clinically significant?  Yes []  OR No[x] 

## 2022-07-10 NOTE — Research (Signed)
Are there any labs that are clinically significant?  Yes [] OR No[]                

## 2022-07-23 ENCOUNTER — Other Ambulatory Visit: Payer: Self-pay | Admitting: Internal Medicine

## 2022-08-09 ENCOUNTER — Other Ambulatory Visit: Payer: Self-pay

## 2022-08-09 ENCOUNTER — Emergency Department
Admission: EM | Admit: 2022-08-09 | Discharge: 2022-08-09 | Disposition: A | Discharge status: Home / Self Care | Payer: BC Managed Care – PPO | Attending: Emergency Medicine | Admitting: Emergency Medicine

## 2022-08-09 ENCOUNTER — Emergency Department: Payer: BC Managed Care – PPO

## 2022-08-09 DIAGNOSIS — M5432 Sciatica, left side: Secondary | ICD-10-CM | POA: Diagnosis not present

## 2022-08-09 DIAGNOSIS — R079 Chest pain, unspecified: Secondary | ICD-10-CM | POA: Diagnosis present

## 2022-08-09 DIAGNOSIS — R0789 Other chest pain: Secondary | ICD-10-CM | POA: Insufficient documentation

## 2022-08-09 DIAGNOSIS — I2510 Atherosclerotic heart disease of native coronary artery without angina pectoris: Secondary | ICD-10-CM | POA: Insufficient documentation

## 2022-08-09 LAB — BASIC METABOLIC PANEL
Anion gap: 8 (ref 5–15)
BUN: 15 mg/dL (ref 6–20)
CO2: 23 mmol/L (ref 22–32)
Calcium: 9.6 mg/dL (ref 8.9–10.3)
Chloride: 107 mmol/L (ref 98–111)
Creatinine, Ser: 1.27 mg/dL — ABNORMAL HIGH (ref 0.61–1.24)
GFR, Estimated: >60 mL/min (ref >60)
Glucose, Bld: 113 mg/dL — ABNORMAL HIGH (ref 70–99)
Potassium: 3.6 mmol/L (ref 3.5–5.1)
Sodium: 138 mmol/L (ref 135–145)

## 2022-08-09 LAB — CBC
HCT: 46.1 % (ref 39.0–52.0)
Hemoglobin: 15.4 g/dL (ref 13.0–17.0)
MCH: 30.5 pg (ref 26.0–34.0)
MCHC: 33.4 g/dL (ref 30.0–36.0)
MCV: 91.3 fL (ref 80.0–100.0)
Platelets: 195 10*3/uL (ref 150–400)
RBC: 5.05 MIL/uL (ref 4.22–5.81)
RDW: 12.9 % (ref 11.5–15.5)
WBC: 8.3 10*3/uL (ref 4.0–10.5)
nRBC: 0 % (ref 0.0–0.2)

## 2022-08-09 LAB — TROPONIN I (HIGH SENSITIVITY): Troponin I (High Sensitivity): 11 ng/L (ref <18)

## 2022-08-09 MED ORDER — METHOCARBAMOL 500 MG PO TABS: 500.0000 mg | ORAL_TABLET | Freq: Three times a day (TID) | ORAL | 1 refills | Status: DC | PRN

## 2022-08-09 MED ORDER — PREDNISONE 50 MG PO TABS: 50.0000 mg | ORAL_TABLET | Freq: Every day | ORAL | 0 refills | Status: DC

## 2022-08-09 MED ORDER — KETOROLAC TROMETHAMINE 30 MG/ML IJ SOLN
30.0000 mg | Freq: Once | INTRAMUSCULAR | Status: AC
  Administered 2022-08-09: 30 mg via INTRAVENOUS
  Filled 2022-08-09: qty 1

## 2022-08-09 NOTE — ED Notes (Signed)
See triage note, pt reports chest pain and shob that started this am, along with left hip pain. Reports moving furniture yesterday. Hx sciatic pain.  NAD noted.

## 2022-08-09 NOTE — ED Triage Notes (Signed)
Pt here with chest pain and left hip pain. Pt state he has a hx of sciatic nerve issues and he was moving some things around yesterday that started his pain. Pt also states h began having let sided chest pain this morning. Pt denies NVD.

## 2022-08-09 NOTE — ED Provider Notes (Signed)
[facility] Provider Note    Event Date/Time   First MD Initiated Contact with Patient 08/09/22 [phone]     (approximate)   History   Chest Pain and Hip Pain   HPI  Brady Johnson is a 62 y.o. male with a history of coronary artery disease, hypertrophic abdominal myopathy paroxysmal SVT who presents with complaints of left hip pain.  Patient reports he was moving a mattress yesterday, developed pain in his low back that traveled through his hip and into his left leg, no weakness but painful to move.  Does have a history of sciatic nerve issues in the past.  Also reports this morning he felt brief episode of chest discomfort, now resolved.  No shortness of breath.  No pleurisy.  No cough.  No nausea vomiting or diaphoresis.  Review of records demonstrates the patient does see [facility] cardiology, last visit February 22 of this year.     Physical Exam   Triage Vital Signs: ED Triage Vitals  Enc Vitals Group     BP 08/09/22 0920 (!) 140/80     Pulse Rate 08/09/22 0920 (!) 57     Resp 08/09/22 0920 16     Temp --      Temp src --      SpO2 --      Weight --      Height 08/09/22 0919 1.702 m (5\' 7" )     Head Circumference --      Peak Flow --      Pain Score 08/09/22 0919 8     Pain Loc --      Pain Edu? --      Excl. in GC? --     Most recent vital signs: Vitals:   08/09/22 1000 08/09/22 1030  BP: 138/83 137/82  Pulse: (!) 56 63  Resp: 13 20  Temp:    SpO2: 96% 98%     General: Awake, no distress.  CV:  Good peripheral perfusion.  Regular rate and rhythm Resp:  Normal effort.  Abd:  No distention.  Other:  Normal range of motion of the left lower extremity, no pain with axial load on the hip, warm and well-perfused distally   ED Results / Procedures / Treatments   Labs (all labs ordered are listed, but only abnormal results are displayed) Labs Reviewed  BASIC METABOLIC PANEL - Abnormal; Notable for the following components:      Result  Value   Glucose, Bld 113 (*)    Creatinine, Ser 1.27 (*)    All other components within normal limits  CBC  TROPONIN I (HIGH SENSITIVITY)     EKG  ED ECG REPORT I, Jene Every, the attending physician, personally viewed and interpreted this ECG.  Date: 08/09/2022  Rhythm: normal sinus rhythm QRS Axis: normal Intervals: normal ST/T Wave abnormalities: T wave inversions Narrative Interpretation: No significant change from prior EKGs    RADIOLOGY Chest x-ray viewed interpreted by me, no acute abnormality    PROCEDURES:  Critical Care performed:   Procedures   MEDICATIONS ORDERED IN ED: Medications  ketorolac (TORADOL) 30 MG/ML injection 30 mg (30 mg Intravenous Given 08/09/22 0937)     IMPRESSION / MDM / ASSESSMENT AND PLAN / ED COURSE  I reviewed the triage vital signs and the nursing notes. Patient's presentation is most consistent with acute presentation with potential threat to life or bodily function.  Patient presents with hip pain as well as chest discomfort.  He seems to be unrelated.  Hip pain started after moving an object yesterday, likely related to sciatica/musculoskeletal pain.  Extremities warm and well-perfused with normal strength, normal neuroexam  Patient has a history of CAD but feels that his chest discomfort may have been related to anxiety, overall he is well-appearing, his EKG is unchanged from prior, he is no longer having chest pain in the emergency department.  Pending high-sensitivity troponin, chest x-ray reassuring.  High sensitive troponin is normal, EKG unchanged from prior, patient is asymptomatic, no indication for admission at this time, discussed with patient he agrees.  Return precautions discussed.  Will treat with prednisone burst, Robaxin, outpatient follow-up      FINAL CLINICAL IMPRESSION(S) / ED DIAGNOSES   Final diagnoses:  Atypical chest pain  Sciatica of left side     Rx / DC Orders   ED Discharge Orders           Ordered    methocarbamol (ROBAXIN) 500 MG tablet  Every 8 hours PRN        08/09/22 1035    predniSONE (DELTASONE) 50 MG tablet  Daily with breakfast        08/09/22 1035             Note:  This document was prepared using Dragon voice recognition software and may include unintentional dictation errors.   Jene Every, MD 08/09/22 1037

## 2022-08-09 NOTE — ED Notes (Signed)
Pt to XRAY

## 2022-08-09 NOTE — ED Notes (Signed)
ED Provider at bedside. 

## 2022-08-16 ENCOUNTER — Emergency Department
Admission: EM | Admit: 2022-08-16 | Discharge: 2022-08-16 | Disposition: A | Discharge status: Home / Self Care | Payer: BC Managed Care – PPO | Attending: Emergency Medicine | Admitting: Emergency Medicine

## 2022-08-16 ENCOUNTER — Emergency Department: Payer: BC Managed Care – PPO

## 2022-08-16 ENCOUNTER — Other Ambulatory Visit: Payer: Self-pay

## 2022-08-16 DIAGNOSIS — I10 Essential (primary) hypertension: Secondary | ICD-10-CM | POA: Diagnosis not present

## 2022-08-16 DIAGNOSIS — M5416 Radiculopathy, lumbar region: Secondary | ICD-10-CM | POA: Diagnosis not present

## 2022-08-16 DIAGNOSIS — M79604 Pain in right leg: Secondary | ICD-10-CM | POA: Insufficient documentation

## 2022-08-16 DIAGNOSIS — T148XXA Other injury of unspecified body region, initial encounter: Secondary | ICD-10-CM

## 2022-08-16 DIAGNOSIS — M545 Low back pain, unspecified: Secondary | ICD-10-CM | POA: Diagnosis present

## 2022-08-16 LAB — BASIC METABOLIC PANEL
Anion gap: 9 (ref 5–15)
BUN: 21 mg/dL — ABNORMAL HIGH (ref 6–20)
CO2: 25 mmol/L (ref 22–32)
Calcium: 9.2 mg/dL (ref 8.9–10.3)
Chloride: 101 mmol/L (ref 98–111)
Creatinine, Ser: 1.11 mg/dL (ref 0.61–1.24)
GFR, Estimated: >60 mL/min (ref >60)
Glucose, Bld: 129 mg/dL — ABNORMAL HIGH (ref 70–99)
Potassium: 4 mmol/L (ref 3.5–5.1)
Sodium: 135 mmol/L (ref 135–145)

## 2022-08-16 LAB — URINALYSIS, ROUTINE W REFLEX MICROSCOPIC
Bacteria, UA: NONE SEEN
Bilirubin Urine: NEGATIVE
Glucose, UA: NEGATIVE mg/dL
Hgb urine dipstick: NEGATIVE
Ketones, ur: NEGATIVE mg/dL
Leukocytes,Ua: NEGATIVE
Nitrite: NEGATIVE
Protein, ur: NEGATIVE mg/dL
Specific Gravity, Urine: 1.014 (ref 1.005–1.030)
Squamous Epithelial / HPF: NONE SEEN /HPF (ref 0–5)
pH: 6 (ref 5.0–8.0)

## 2022-08-16 LAB — CBC WITH DIFFERENTIAL/PLATELET
Abs Immature Granulocytes: 0.11 10*3/uL — ABNORMAL HIGH (ref 0.00–0.07)
Basophils Absolute: 0 10*3/uL (ref 0.0–0.1)
Basophils Relative: 0 %
Eosinophils Absolute: 0 10*3/uL (ref 0.0–0.5)
Eosinophils Relative: 0 %
HCT: 47.8 % (ref 39.0–52.0)
Hemoglobin: 16.5 g/dL (ref 13.0–17.0)
Immature Granulocytes: 1 %
Lymphocytes Relative: 10 %
Lymphs Abs: 1.2 10*3/uL (ref 0.7–4.0)
MCH: 31.2 pg (ref 26.0–34.0)
MCHC: 34.5 g/dL (ref 30.0–36.0)
MCV: 90.4 fL (ref 80.0–100.0)
Monocytes Absolute: 0.4 10*3/uL (ref 0.1–1.0)
Monocytes Relative: 3 %
Neutro Abs: 10 10*3/uL — ABNORMAL HIGH (ref 1.7–7.7)
Neutrophils Relative %: 86 %
Platelets: 228 10*3/uL (ref 150–400)
RBC: 5.29 MIL/uL (ref 4.22–5.81)
RDW: 12.9 % (ref 11.5–15.5)
WBC: 11.7 10*3/uL — ABNORMAL HIGH (ref 4.0–10.5)
nRBC: 0 % (ref 0.0–0.2)

## 2022-08-16 MED ORDER — SODIUM CHLORIDE 0.9 % IV BOLUS
1000.0000 mL | Freq: Once | INTRAVENOUS | Status: AC
  Administered 2022-08-16: 1000 mL via INTRAVENOUS

## 2022-08-16 MED ORDER — HYDROCODONE-ACETAMINOPHEN 5-325 MG PO TABS: 1.0000 | ORAL_TABLET | Freq: Four times a day (QID) | ORAL | 0 refills | Status: DC | PRN

## 2022-08-16 MED ORDER — MORPHINE SULFATE (PF) 4 MG/ML IV SOLN
4.0000 mg | Freq: Once | INTRAVENOUS | Status: AC
  Administered 2022-08-16: 4 mg via INTRAVENOUS
  Filled 2022-08-16: qty 1

## 2022-08-16 MED ORDER — METHYLPREDNISOLONE 4 MG PO TBPK: ORAL_TABLET | ORAL | 0 refills | Status: DC

## 2022-08-16 MED ORDER — ONDANSETRON HCL 4 MG/2ML IJ SOLN
4.0000 mg | Freq: Once | INTRAMUSCULAR | Status: AC
  Administered 2022-08-16: 4 mg via INTRAVENOUS
  Filled 2022-08-16: qty 2

## 2022-08-16 NOTE — ED Notes (Signed)
427 ml with bladder scan

## 2022-08-16 NOTE — ED Notes (Signed)
Patient taken to MRI

## 2022-08-16 NOTE — ED Provider Notes (Signed)
[facility] Provider Note    Event Date/Time   First MD Initiated Contact with Patient 08/16/22 1301     (approximate)   History   Back Pain   HPI  Brady Johnson is a 62 y.o. male with history of hypertension, hypertrophic cardiomyopathy, erectile dysfunction, gout and hyperlipidemia along with hyperandrogenism presents emergency department with complaints of inability to urinate combined with increasing lower back pain.  Patient was seen here for low back pain and given prednisone and methocarbamol.  Patient started taking the medication.  Followed up with his regular doctor few days later continue to have low back pain was placed on more steroids and gabapentin.  Today the patient awoke and was able to get a small dribble out for urination.  Has not been able to urinate since then.  Feels a slight amount of pressure but no pain.  States he is not drinking anything because he does not want it to get worse.  Is concerned because the low back pain is increasing also with more radiation to the left leg.      Physical Exam   Triage Vital Signs: ED Triage Vitals  Enc Vitals Group     BP 08/16/22 1230 (!) 127/90     Pulse Rate 08/16/22 1230 72     Resp 08/16/22 1230 14     Temp 08/16/22 1230 98.7 F (37.1 C)     Temp Source 08/16/22 1230 Oral     SpO2 08/16/22 1230 97 %     Weight --      Height --      Head Circumference --      Peak Flow --      Pain Score 08/16/22 1231 9     Pain Loc --      Pain Edu? --      Excl. in GC? --     Most recent vital signs: Vitals:   08/16/22 1230 08/16/22 1540  BP: (!) 127/90 131/81  Pulse: 72 73  Resp: 14 16  Temp: 98.7 F (37.1 C)   SpO2: 97% 93%     General: Awake, no distress.   CV:  Good peripheral perfusion. regular rate and  rhythm Resp:  Normal effort.  Abd:  No distention.   Other:  5/5 strength in lower extremity, lumbar spine tender to palpation, positive straight leg raise, neurovascular  appears to be intact   ED Results / Procedures / Treatments   Labs (all labs ordered are listed, but only abnormal results are displayed) Labs Reviewed  CBC WITH DIFFERENTIAL/PLATELET - Abnormal; Notable for the following components:      Result Value   WBC 11.7 (*)    Neutro Abs 10.0 (*)    Abs Immature Granulocytes 0.11 (*)    All other components within normal limits  BASIC METABOLIC PANEL - Abnormal; Notable for the following components:   Glucose, Bld 129 (*)    BUN 21 (*)    All other components within normal limits  URINALYSIS, ROUTINE W REFLEX MICROSCOPIC - Abnormal; Notable for the following components:   Color, Urine YELLOW (*)    APPearance CLEAR (*)    All other components within normal limits     EKG     RADIOLOGY Right of the lumbar spine    PROCEDURES:   Procedures   MEDICATIONS ORDERED IN ED: Medications  sodium chloride 0.9 % bolus 1,000 mL (1,000 mLs Intravenous New Bag/Given 08/16/22 1531)  morphine (PF)  4 MG/ML injection 4 mg (4 mg Intravenous Given 08/16/22 1535)  ondansetron (ZOFRAN) injection 4 mg (4 mg Intravenous Given 08/16/22 1532)     IMPRESSION / MDM / ASSESSMENT AND PLAN / ED COURSE  I reviewed the triage vital signs and the nursing notes.                              Differential diagnosis includes, but is not limited to, cauda equina, urinary retention, bulging disc, lumbar radiculopathy, sciatica  Patient's presentation is most consistent with acute presentation with potential threat to life or bodily function.   Due to the patient's inability to urinate combined with the low back pain will assess with an MRI for cauda equina  Patient will have basic labs, will try to get a urine however bladder scan is showing 457 mL in the bladder.  If patient is unable to urinate after returning from MRI we will do a Foley catheter  Patient was able to urinate after MRI  MRI of the lumbar spine, I did review the radiologist reading,  interpret this as being positive for nerve compression on the S1 nerve root from the L5-S1 bulging disc  Labs are reassuring  I did explain the findings to the patient.  Feel he should follow-up with neurosurgery.  Do not feel this is cauda equina is not an emergent surgery today.  He will be sent home with pain medication and a Medrol Dosepak.  Patient is in agreement treatment plan.  Secure message was sent to Dr. Marcell Barlow as a FYI message informing him that I had instructed the patient to follow-up with him.  Patient is in agreement with all treatment plan.  He was discharged stable condition.      FINAL CLINICAL IMPRESSION(S) / ED DIAGNOSES   Final diagnoses:  Nerve compression  Lumbar radiculopathy, acute     Rx / DC Orders   ED Discharge Orders          Ordered    methylPREDNISolone (MEDROL DOSEPAK) 4 MG TBPK tablet        08/16/22 1602    HYDROcodone-acetaminophen (NORCO/VICODIN) 5-325 MG tablet  Every 6 hours PRN        08/16/22 1602             Note:  This document was prepared using Dragon voice recognition software and may include unintentional dictation errors.    Faythe Ghee, PA-C 08/16/22 1616    Concha Se, MD 08/16/22 [phone]

## 2022-08-16 NOTE — ED Notes (Signed)
Patient was given a  urinal. Patient states  he can use it independently. Greig Right, PA-C aware.

## 2022-08-16 NOTE — ED Triage Notes (Signed)
Pt presents via POV c/o lower back pain and right leg pain. Reports here last week for same thing. Started on prednisone and gabapentin by PCP. Denies improvement.

## 2022-08-16 NOTE — ED Notes (Signed)
Family brought a chair. Patient requested a pillow. Patient states he has difficulty urinating.

## 2022-08-16 NOTE — Discharge Instructions (Signed)
Follow-up with Dr. Marcell Barlow.  Please call him for an appointment.  Tell him you are seen in the emergency department and you have a pinched nerve at S1.  He can look at your images from the emergency department.  Take medications as prescribed.  Return if worsening

## 2022-08-17 NOTE — H&P (View-Only) (Signed)
  Referring Physician:  Fisher, Susan W, PA-C [address] [place],  [place] [address]  Primary Physician:  [facility]  History of Present Illness: 08/18/2022 Mr. Brady Johnson is here today with a chief complaint of severe left leg pain and weakness.  This began approximately 2 weeks ago.  He was moving a mattress when he began having discomfort and pain down his left leg into his heel.  He has numbness in his foot.  His pain is worst on the left side.  He has minimal pain on the right.  Anything he does makes it worse.  He has started walking with a walker.  Nothing has really helped.  He presented to the emergency department over the weekend due to pain.  He has tried a steroid taper but is not having good control of his pain.     Bowel/Bladder Dysfunction: Some difficulty getting urine stream started, but able to empty his bladder.  Conservative measures:  Physical therapy:  denies has not participated in Multimodal medical therapy including regular antiinflammatories:  flexeril, gabapentin, prednisone, robaxin Injections:  has not received epidural steroid injections  Past Surgery: denies  Brady Johnson has no symptoms of cervical myelopathy.  The symptoms are causing a significant impact on the patient's life.   I have utilized the care everywhere function in epic to review the outside records available from external health systems.  Review of Systems:  A 10 point review of systems is negative, except for the pertinent positives and negatives detailed in the HPI.  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   Sleep apnea     Past Surgical History: Past Surgical History:  Procedure Laterality Date   CARDIAC CATHETERIZATION     CHOANAL  ADENIODECTOMY     COLONOSCOPY N/A 05/26/2021   Procedure: COLONOSCOPY;  Surgeon: Locklear, Cameron T, MD;  Location: [facility] ENDOSCOPY;  Service: Endoscopy;  Laterality: N/A;   COLONOSCOPY WITH PROPOFOL N/A 05/13/2015   Procedure: COLONOSCOPY WITH PROPOFOL;  Surgeon: Martin U Skulskie, MD;  Location: [facility] ENDOSCOPY;  Service: Endoscopy;  Laterality: N/A;   LEFT HEART CATH AND CORONARY ANGIOGRAPHY N/A 03/29/2019   Procedure: LEFT HEART CATH AND CORONARY ANGIOGRAPHY and possible PCI and stent;  Surgeon: Callwood, Dwayne D, MD;  Location: [facility] INVASIVE CV LAB;  Service: Cardiovascular;  Laterality: N/A;   TONSILLECTOMY     TONSILLECTOMY AND ADENOIDECTOMY      Allergies: Allergies as of 08/18/2022   (No Known Allergies)    Medications:  Current Outpatient Medications:    amLODipine (NORVASC) 10 MG tablet, TAKE 1 TABLET BY MOUTH EVERY DAY, Disp: 90 tablet, Rfl: 0   aspirin EC 81 MG tablet, Take 81 mg by mouth daily., Disp: , Rfl:    atorvastatin (LIPITOR) 20 MG tablet, TAKE 1 TABLET BY MOUTH EVERY DAY, Disp: 90 tablet, Rfl: 0   baclofen (LIORESAL) 10 MG tablet, Take 10 mg by mouth 3 (three) times daily., Disp: , Rfl:    colchicine 0.6 MG tablet, Take 1 tablet by mouth daily as needed. , Disp: , Rfl:    furosemide (LASIX) 20 MG tablet, Take 1 tablet (20 mg total) by mouth daily., Disp: 90 tablet, Rfl: 3   HYDROcodone-acetaminophen (NORCO/VICODIN) 5-325 MG tablet, Take 1 tablet by mouth every 6 (six) hours as   needed for moderate pain., Disp: 15 tablet, Rfl: 0   losartan (COZAAR) 100 MG tablet, Take 100 mg by mouth daily., Disp: , Rfl:    meloxicam (MOBIC) 15 MG tablet, Take 15 mg by mouth daily., Disp: , Rfl:    methocarbamol (ROBAXIN) 500 MG tablet, Take 1 tablet (500 mg total) by mouth every 8 (eight) hours as needed for muscle spasms., Disp: 20 tablet, Rfl: 1   methylPREDNISolone (MEDROL DOSEPAK) 4 MG TBPK tablet, Take 6 pills on day one then decrease by 1 pill each day, Disp: 21 tablet, Rfl: 0    metoprolol succinate (TOPROL-XL) 50 MG 24 hr tablet, TAKE 1 TABLET BY MOUTH EVERY DAY, Disp: 90 tablet, Rfl: 1   predniSONE (DELTASONE) 50 MG tablet, Take 1 tablet (50 mg total) by mouth daily with breakfast., Disp: 5 tablet, Rfl: 0   sildenafil (REVATIO) 20 MG tablet, Take 20 mg by mouth as needed., Disp: , Rfl:    Study - VICTORION-1 PREVENT - inclisiran 300 mg/1.5mL or placebo SQ injection (PI-Stuckey), Inject 300 mg into the skin every 6 (six) months. For Investigational Use Only. Inject subcutaneously into the abdomen, upper arm or thigh every 6 months in clinic. Do not inject into areas of active skin disease,such as sun burns, skin rashes, inflammation, or skin infections. Please contact [facility] for any questions or concerns regarding this medication., Disp: , Rfl:   Current Facility-Administered Medications:    Study - VICTORION-1 PREVENT - inclisiran 300 mg/1.5mL or placebo SQ injection (PI-Stuckey), 300 mg, Subcutaneous, Q6 months, Christopher, Bridgette, MD, 300 mg at 07/06/22 0924  Social History: Social History   Tobacco Use   Smoking status: Never   Smokeless tobacco: Never  Vaping Use   Vaping Use: Never used  Substance Use Topics   Alcohol use: No   Drug use: No    Family Medical History: Family History  Problem Relation Age of Onset   Heart attack Father 43   Heart attack Nephew 33   Heart failure Neg Hx    Sudden Cardiac Death Neg Hx     Physical Examination: Vitals:   08/18/22 1257  BP: (!) 140/74    General: Patient is in no apparent distress. Attention to examination is appropriate.  Neck:   Supple.  Full range of motion.  Respiratory: Patient is breathing without any difficulty.   NEUROLOGICAL:     Awake, alert, oriented to person, place, and time.  Speech is clear and fluent.   Cranial Nerves: Pupils equal round and reactive to light.  Facial tone is symmetric.  Facial sensation is symmetric. Shoulder shrug is symmetric. Tongue  protrusion is midline.  There is no pronator drift.  Strength: Side Biceps Triceps Deltoid Interossei Grip Wrist Ext. Wrist Flex.  R 5 5 5 5 5 5 5  L 5 5 5 5 5 5 5   Side Iliopsoas Quads Hamstring PF DF EHL  R 5 5 5 5 5 5  L 5 5 4 3 5 5   Reflexes are 1+ and symmetric at the biceps, triceps, brachioradialis, patella and achilles.   Hoffman's is absent.   Straight leg raise positive on the left at 30 degrees.  Bilateral upper and lower extremity sensation is intact to light touch with exception of his left S1 distribution which is diminished.    No evidence of dysmetria noted.  Gait is abnormal and requires a walker.  He has severe pain.     Medical Decision Making  Imaging: MRI   L spine 08/16/2022 Disc levels:   T12-L1:  Normal.   L1-L2:  Normal.   L2-L3:  Normal.   L3-L4: Small disc bulge and mild bilateral facet arthropathy. No spinal canal stenosis or neural foraminal narrowing.   L4-L5: Anterolisthesis with uncovered disc and severe bilateral facet arthropathy with joint effusions and periarticular edema results in moderate right and mild left neural foraminal narrowing. No spinal canal stenosis.   L5-S1: Left subarticular disc protrusion compresses the traversing left S1 nerve root in the left lateral recess. Mild bilateral facet arthropathy. No significant neural foraminal narrowing.   IMPRESSION: 1. At L5-S1, left subarticular disc protrusion compresses the traversing left S1 nerve root in the left lateral recess. 2. At L4-L5, severe bilateral facet arthropathy with joint effusions and periarticular edema, which can be a cause of back pain. Moderate right neural foraminal narrowing at this level.     Electronically Signed   By: Walter  Wiggins M.D.   On: 08/16/2022 15:08  I have personally reviewed the images and agree with the above interpretation.  Assessment and Plan: Brady Johnson is a pleasant 61 y.o. 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  He also has severe facet arthropathy at L4-5 with joint effusions and small amount of anterolisthesis, but no left-sided compression at L4-5.  Based on his symptoms, I feel the S1 compression is the most likely cause of his symptoms.  He has substantial objective weakness.  Due to this weakness, I do not think that further conservative management is indicated.  I have recommended moving forward with an L5-S1 microdiscectomy.  I discussed the planned procedure at length with the patient, including the risks, benefits, alternatives, and indications. The risks discussed include but are not limited to bleeding, infection, need for reoperation, spinal fluid leak, stroke, vision loss, anesthetic complication, coma, paralysis, and even death. I also described in detail that improvement was not guaranteed.  The patient expressed understanding of these risks, and asked that we proceed with surgery. I described the surgery in layman's terms, and gave ample opportunity for questions, which were answered to the best of my ability.  I spent a total of 30 minutes in this patient's care today. This time was spent reviewing pertinent records including imaging studies, obtaining and confirming history, performing a directed evaluation, formulating and discussing my recommendations, and documenting the visit within the medical record.     Thank you for involving me in the care of this patient.      Miko Sirico K. Daniela Hernan MD, MPHS Neurosurgery  

## 2022-08-17 NOTE — Progress Notes (Unsigned)
Referring Physician:  Faythe Ghee, PA-C [address] [place],  [place] [address]  Primary Physician:  [facility]  History of Present Illness: 08/18/2022 Mr. Jibril Erhardt is here today with a chief complaint of severe left leg pain and weakness.  This began approximately 2 weeks ago.  He was moving a mattress when he began having discomfort and pain down his left leg into his heel.  He has numbness in his foot.  His pain is worst on the left side.  He has minimal pain on the right.  Anything he does makes it worse.  He has started walking with a walker.  Nothing has really helped.  He presented to the emergency department over the weekend due to pain.  He has tried a steroid taper but is not having good control of his pain.     Bowel/Bladder Dysfunction: Some difficulty getting urine stream started, but able to empty his bladder.  Conservative measures:  Physical therapy:  denies has not participated in Multimodal medical therapy including regular antiinflammatories:  flexeril, gabapentin, prednisone, robaxin Injections:  has not received epidural steroid injections  Past Surgery: denies  TOLLIE GARGUS has no symptoms of cervical myelopathy.  The symptoms are causing a significant impact on the patient's life.   I have utilized the care everywhere function in epic to review the outside records available from external health systems.  Review of Systems:  A 10 point review of systems is negative, except for the pertinent positives and negatives detailed in the HPI.  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   Sleep apnea     Past Surgical History: Past Surgical History:  Procedure Laterality Date   CARDIAC CATHETERIZATION     CHOANAL  ADENIODECTOMY     COLONOSCOPY N/A 05/26/2021   Procedure: COLONOSCOPY;  Surgeon: Regis Bill, MD;  Location: [facility] ENDOSCOPY;  Service: Endoscopy;  Laterality: N/A;   COLONOSCOPY WITH PROPOFOL N/A 05/13/2015   Procedure: COLONOSCOPY WITH PROPOFOL;  Surgeon: Christena Deem, MD;  Location: [facility] ENDOSCOPY;  Service: Endoscopy;  Laterality: N/A;   LEFT HEART CATH AND CORONARY ANGIOGRAPHY N/A 03/29/2019   Procedure: LEFT HEART CATH AND CORONARY ANGIOGRAPHY and possible PCI and stent;  Surgeon: Alwyn Pea, MD;  Location: [facility] INVASIVE CV LAB;  Service: Cardiovascular;  Laterality: N/A;   TONSILLECTOMY     TONSILLECTOMY AND ADENOIDECTOMY      Allergies: Allergies as of 08/18/2022   (No Known Allergies)    Medications:  Current Outpatient Medications:    amLODipine (NORVASC) 10 MG tablet, TAKE 1 TABLET BY MOUTH EVERY DAY, Disp: 90 tablet, Rfl: 0   aspirin EC 81 MG tablet, Take 81 mg by mouth daily., Disp: , Rfl:    atorvastatin (LIPITOR) 20 MG tablet, TAKE 1 TABLET BY MOUTH EVERY DAY, Disp: 90 tablet, Rfl: 0   baclofen (LIORESAL) 10 MG tablet, Take 10 mg by mouth 3 (three) times daily., Disp: , Rfl:    colchicine 0.6 MG tablet, Take 1 tablet by mouth daily as needed. , Disp: , Rfl:    furosemide (LASIX) 20 MG tablet, Take 1 tablet (20 mg total) by mouth daily., Disp: 90 tablet, Rfl: 3   HYDROcodone-acetaminophen (NORCO/VICODIN) 5-325 MG tablet, Take 1 tablet by mouth every 6 (six) hours as  needed for moderate pain., Disp: 15 tablet, Rfl: 0   losartan (COZAAR) 100 MG tablet, Take 100 mg by mouth daily., Disp: , Rfl:    meloxicam (MOBIC) 15 MG tablet, Take 15 mg by mouth daily., Disp: , Rfl:    methocarbamol (ROBAXIN) 500 MG tablet, Take 1 tablet (500 mg total) by mouth every 8 (eight) hours as needed for muscle spasms., Disp: 20 tablet, Rfl: 1   methylPREDNISolone (MEDROL DOSEPAK) 4 MG TBPK tablet, Take 6 pills on day one then decrease by 1 pill each day, Disp: 21 tablet, Rfl: 0    metoprolol succinate (TOPROL-XL) 50 MG 24 hr tablet, TAKE 1 TABLET BY MOUTH EVERY DAY, Disp: 90 tablet, Rfl: 1   predniSONE (DELTASONE) 50 MG tablet, Take 1 tablet (50 mg total) by mouth daily with breakfast., Disp: 5 tablet, Rfl: 0   sildenafil (REVATIO) 20 MG tablet, Take 20 mg by mouth as needed., Disp: , Rfl:    Study - VICTORION-1 PREVENT - inclisiran 300 mg/1.47mL or placebo SQ injection (PI-Stuckey), Inject 300 mg into the skin every 6 (six) months. For Investigational Use Only. Inject subcutaneously into the abdomen, upper arm or thigh every 6 months in clinic. Do not inject into areas of active skin disease,such as sun burns, skin rashes, inflammation, or skin infections. Please contact [facility] for any questions or concerns regarding this medication., Disp: , Rfl:   Current Facility-Administered Medications:    Study - VICTORION-1 PREVENT - inclisiran 300 mg/1.68mL or placebo SQ injection (PI-Stuckey), 300 mg, Subcutaneous, Q6 months, Jodelle Red, MD, 300 mg at 07/06/22 1610  Social History: Social History   Tobacco Use   Smoking status: Never   Smokeless tobacco: Never  Vaping Use   Vaping Use: Never used  Substance Use Topics   Alcohol use: No   Drug use: No    Family Medical History: Family History  Problem Relation Age of Onset   Heart attack Father 1   Heart attack Nephew 33   Heart failure Neg Hx    Sudden Cardiac Death Neg Hx     Physical Examination: Vitals:   08/18/22 1257  BP: (!) 140/74    General: Patient is in no apparent distress. Attention to examination is appropriate.  Neck:   Supple.  Full range of motion.  Respiratory: Patient is breathing without any difficulty.   NEUROLOGICAL:     Awake, alert, oriented to person, place, and time.  Speech is clear and fluent.   Cranial Nerves: Pupils equal round and reactive to light.  Facial tone is symmetric.  Facial sensation is symmetric. Shoulder shrug is symmetric. Tongue  protrusion is midline.  There is no pronator drift.  Strength: Side Biceps Triceps Deltoid Interossei Grip Wrist Ext. Wrist Flex.  R 5 5 5 5 5 5 5   L 5 5 5 5 5 5 5    Side Iliopsoas Quads Hamstring PF DF EHL  R 5 5 5 5 5 5   L 5 5 4 3 5 5    Reflexes are 1+ and symmetric at the biceps, triceps, brachioradialis, patella and achilles.   Hoffman's is absent.   Straight leg raise positive on the left at 30 degrees.  Bilateral upper and lower extremity sensation is intact to light touch with exception of his left S1 distribution which is diminished.    No evidence of dysmetria noted.  Gait is abnormal and requires a walker.  He has severe pain.     Medical Decision Making  Imaging: MRI  L spine 08/16/2022 Disc levels:   T12-L1:  Normal.   L1-L2:  Normal.   L2-L3:  Normal.   L3-L4: Small disc bulge and mild bilateral facet arthropathy. No spinal canal stenosis or neural foraminal narrowing.   L4-L5: Anterolisthesis with uncovered disc and severe bilateral facet arthropathy with joint effusions and periarticular edema results in moderate right and mild left neural foraminal narrowing. No spinal canal stenosis.   L5-S1: Left subarticular disc protrusion compresses the traversing left S1 nerve root in the left lateral recess. Mild bilateral facet arthropathy. No significant neural foraminal narrowing.   IMPRESSION: 1. At L5-S1, left subarticular disc protrusion compresses the traversing left S1 nerve root in the left lateral recess. 2. At L4-L5, severe bilateral facet arthropathy with joint effusions and periarticular edema, which can be a cause of back pain. Moderate right neural foraminal narrowing at this level.     Electronically Signed   By: Orvan Falconer M.D.   On: 08/16/2022 15:08  I have personally reviewed the images and agree with the above interpretation.  Assessment and Plan: Mr. Voisine is a pleasant 62 y.o. 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  He also has severe facet arthropathy at L4-5 with joint effusions and small amount of anterolisthesis, but no left-sided compression at L4-5.  Based on his symptoms, I feel the S1 compression is the most likely cause of his symptoms.  He has substantial objective weakness.  Due to this weakness, I do not think that further conservative management is indicated.  I have recommended moving forward with an L5-S1 microdiscectomy.  I discussed the planned procedure at length with the patient, including the risks, benefits, alternatives, and indications. The risks discussed include but are not limited to bleeding, infection, need for reoperation, spinal fluid leak, stroke, vision loss, anesthetic complication, coma, paralysis, and even death. I also described in detail that improvement was not guaranteed.  The patient expressed understanding of these risks, and asked that we proceed with surgery. I described the surgery in layman's terms, and gave ample opportunity for questions, which were answered to the best of my ability.  I spent a total of 30 minutes in this patient's care today. This time was spent reviewing pertinent records including imaging studies, obtaining and confirming history, performing a directed evaluation, formulating and discussing my recommendations, and documenting the visit within the medical record.     Thank you for involving me in the care of this patient.      Geralda Baumgardner K. Myer Haff MD, [facility] Neurosurgery

## 2022-08-18 ENCOUNTER — Encounter
Admission: RE | Admit: 2022-08-18 | Discharge: 2022-08-18 | Disposition: A | Discharge status: Home / Self Care | Payer: BC Managed Care – PPO | Source: Ambulatory Visit | Attending: Neurosurgery | Admitting: Neurosurgery

## 2022-08-18 ENCOUNTER — Telehealth: Payer: Self-pay

## 2022-08-18 ENCOUNTER — Encounter: Payer: Self-pay | Admitting: Neurosurgery

## 2022-08-18 ENCOUNTER — Ambulatory Visit: Payer: BC Managed Care – PPO | Admitting: Neurosurgery

## 2022-08-18 ENCOUNTER — Other Ambulatory Visit: Payer: Self-pay

## 2022-08-18 ENCOUNTER — Encounter: Payer: Self-pay | Admitting: Urgent Care

## 2022-08-18 VITALS — BP 140/74 | Ht 68.0 in | Wt 209.4 lb

## 2022-08-18 DIAGNOSIS — M5117 Intervertebral disc disorders with radiculopathy, lumbosacral region: Secondary | ICD-10-CM

## 2022-08-18 DIAGNOSIS — R29898 Other symptoms and signs involving the musculoskeletal system: Secondary | ICD-10-CM

## 2022-08-18 DIAGNOSIS — M5416 Radiculopathy, lumbar region: Secondary | ICD-10-CM

## 2022-08-18 DIAGNOSIS — M4316 Spondylolisthesis, lumbar region: Secondary | ICD-10-CM

## 2022-08-18 DIAGNOSIS — Z01812 Encounter for preprocedural laboratory examination: Secondary | ICD-10-CM | POA: Diagnosis present

## 2022-08-18 DIAGNOSIS — Z01818 Encounter for other preprocedural examination: Secondary | ICD-10-CM

## 2022-08-18 LAB — TYPE AND SCREEN
ABO/RH(D): O POS
Antibody Screen: NEGATIVE

## 2022-08-18 LAB — SURGICAL PCR SCREEN
MRSA, PCR: NEGATIVE
Staphylococcus aureus: NEGATIVE

## 2022-08-18 MED ORDER — OXYCODONE HCL 5 MG PO TABS: 5.0000 mg | ORAL_TABLET | ORAL | 0 refills | Status: AC | PRN

## 2022-08-18 NOTE — Addendum Note (Signed)
Addended by: Venetia Night on: 08/18/2022 02:15 PM   Modules accepted: Orders

## 2022-08-18 NOTE — Patient Instructions (Signed)
Please see below for information in regards to your upcoming surgery:   Planned surgery: Left L5-S1 microdiscectomy   Surgery date: 08/26/22 - you will find out your arrival time the business day before your surgery.   Pre-op appointment at [facility] Pre-admit Testing: we will call you with a date/time for this. Pre-admit testing is located on the first floor of the [facility] building, [address] [facility] [address], [address]. Please bring all prescriptions in the original prescription bottles to your appointment, even if you have reviewed medications by phone with a pharmacy representative. Pre-op labs may be done at your pre-op appointment. You are not required to fast for these labs. Should you need to change your pre-op appointment, please call Pre-admit testing at [phone].    Surgical clearance: we will send a clearance form to [facility]    Blood thinners: ok to stay on aspirin 81mg     If you have FMLA/disability paperwork, please drop it off or fax it to [phone], attention Patty.   We can be reached by phone or mychart 8am-4pm, Monday-Friday. If you have any questions/concerns before or after surgery, you can reach [place] at [phone], or you can send a mychart message. If you have a concern after hours that cannot wait until normal business hours, you can call [phone] and ask to page the neurosurgeon on call for [place].    Appointments/FMLA & disability paperwork: Patty & Cristin  Nurse: Royston Cowper  Medical assistants: Laurann [place] Physician Assistant's: Manning Charity & Drake Leach Surgeon: Venetia Night, MD

## 2022-08-18 NOTE — Addendum Note (Signed)
Addended by: Sharlot Gowda on: 08/18/2022 01:51 PM   Modules accepted: Orders

## 2022-08-18 NOTE — Telephone Encounter (Signed)
Authorization for Oxycodone has been initiated through Cover My Meds.   PA Case ID #: [id number]

## 2022-08-19 NOTE — Telephone Encounter (Signed)
Medication has been approved and is valid from 08/18/22 to 09/18/22.  I have faxed this to the pharmacy.

## 2022-08-21 ENCOUNTER — Telehealth: Payer: Self-pay | Admitting: Internal Medicine

## 2022-08-21 ENCOUNTER — Encounter: Payer: Self-pay | Admitting: Urgent Care

## 2022-08-21 ENCOUNTER — Telehealth: Payer: Self-pay | Admitting: *Deleted

## 2022-08-21 NOTE — Telephone Encounter (Signed)
   Patient Name: Brady Johnson  DOB: [birth year] MRN: [id number]  Primary Cardiologist: Yvonne Kendall, MD  Chart reviewed as part of pre-operative protocol coverage. To summarize recommendations:  -Neurosurgery is considered high bleeding risk.  Would recommend a 5 to 7-day aspirin hold.  Patient is taking aspirin for nonobstructive CAD.  Risk will be relatively low for aspirin hold.  Recently seen by Dr. Okey Dupre in February.  Medical clearance not requested.  Will route this bundled recommendation to requesting provider via Epic fax function and remove from pre-op pool. Please call with questions.  Sharlene Dory, PA-C 08/21/2022, 4:24 PM

## 2022-08-21 NOTE — Telephone Encounter (Signed)
Brady Johnson with NeuroSurgery is calling wanting to confirm the clearance request faxed yesterday to [phone] was received. If the request is needing to be resent she states you can secure chat her or callback to her direct line left in contact info.

## 2022-08-21 NOTE — Telephone Encounter (Signed)
   Pre-operative Risk Assessment    Patient Name: Brady Johnson  DOB: Jul 06, 1960 MRN: [id number]{      Request for Surgical Clearance    Procedure:   LEFT L5-S1 MICRODISCECTOMY  Date of Surgery:  Clearance 08/26/22                              Surgeon:  DR Myer Haff Surgeon's Group or Practice Name:  [facility] NEUROSURGERY Phone number:  [phone] Fax number:  [phone]   Type of Clearance Requested:   - Pharmacy:  Hold Aspirin OK TO STAY ON 81 MG?  Type of Anesthesia:  General    Additional requests/questions:    Signed, Dalia Heading   08/21/2022, 1:58 PM

## 2022-08-21 NOTE — Telephone Encounter (Signed)
-----   Message from Verlee Monte, NP sent at 08/21/2022  5:34 PM EDT ----- Regarding: Request for pre-operative cardiac clearance Request for pre-operative cardiac clearance:  1. What type of surgery is being performed?  LEFT L5-S1 MICRODISCECTOMY  2. When is this surgery scheduled?  08/26/2022  3. Type of clearance being requested (medical, pharmacy, both)? MEDICAL   4. Are there any medications that need to be held prior to surgery? Recommendations already provided for ASA  5. Practice name and name of physician performing surgery?  Performing surgeon: Dr. Venetia Night, MD Requesting clearance: Quentin Mulling, FNP-C    6. Anesthesia type (none, local, MAC, general)? GENERAL  7. What is the office phone and fax number?   Phone: [phone] Fax: [phone]  ATTENTION: Unable to create telephone message as per your standard workflow. Directed by [facility] providers to send requests for cardiac clearance to this pool for appropriate distribution to provider covering pre-operative clearances.   Quentin Mulling, MSN, APRN, FNP-C, CEN [facility]  Peri-operative Services Nurse Practitioner Phone: [phone] 08/21/22 5:34 PM

## 2022-08-24 ENCOUNTER — Telehealth: Payer: Self-pay | Admitting: *Deleted

## 2022-08-24 ENCOUNTER — Other Ambulatory Visit: Payer: Self-pay

## 2022-08-24 ENCOUNTER — Encounter
Admission: RE | Admit: 2022-08-24 | Discharge: 2022-08-24 | Disposition: A | Discharge status: Home / Self Care | Payer: BC Managed Care – PPO | Source: Ambulatory Visit | Attending: Neurosurgery | Admitting: Neurosurgery

## 2022-08-24 ENCOUNTER — Ambulatory Visit: Payer: BC Managed Care – PPO | Attending: Physician Assistant

## 2022-08-24 ENCOUNTER — Encounter: Payer: Self-pay | Admitting: Neurosurgery

## 2022-08-24 DIAGNOSIS — Z01810 Encounter for preprocedural cardiovascular examination: Secondary | ICD-10-CM | POA: Diagnosis not present

## 2022-08-24 HISTORY — DX: Cardiac murmur, unspecified: R01.1

## 2022-08-24 NOTE — Patient Instructions (Signed)
Your procedure is scheduled on: Wednesday 08/26/22 To find out your arrival time, please call [phone] between 1PM - 3PM on:   Tuesday 08/25/22 Report to the Registration Desk on the 1st floor of the [facility]. Free Valet parking is available.  If your arrival time is 6:00 am, do not arrive before that time as the [facility] entrance doors do not open until 6:00 am.  REMEMBER: Instructions that are not followed completely may result in serious medical risk, up to and including death; or upon the discretion of your surgeon and anesthesiologist your surgery may need to be rescheduled.  Do not eat food or drink any liquids after midnight the night before surgery.  No gum chewing or hard candies.  One week prior to surgery: Stop Anti-inflammatories (NSAIDS) such as Advil, Aleve, Ibuprofen, Motrin, Naproxen, Naprosyn and Aspirin based products such as Excedrin, Goody's Powder, BC Powder. This includes the Meloxicam/Mobic You may however, continue to take Tylenol if needed for pain up until the day of surgery.  Stop ANY OVER THE COUNTER supplements or vitamins until after surgery.  Continue taking all prescribed medications until surgery day.  TAKE ONLY THESE MEDICATIONS THE MORNING OF SURGERY WITH A SIP OF WATER:  amLODipine (NORVASC) 10 MG tablet  atorvastatin (LIPITOR) 20 MG tablet  metoprolol succinate (TOPROL-XL) 50 MG 24 hr tablet  oxyCODONE (OXY IR/ROXICODONE) 5 MG immediate release tablet if needed  No Alcohol for 24 hours before or after surgery.  No Smoking including e-cigarettes for 24 hours before surgery.  No chewable tobacco products for at least 6 hours before surgery.  No nicotine patches on the day of surgery.  Do not use any "recreational" drugs for at least a week (preferably 2 weeks) before your surgery.  Please be advised that the combination of cocaine and anesthesia may have negative outcomes, up to and including death. If you test positive for cocaine,  your surgery will be cancelled.  On the morning of surgery brush your teeth with toothpaste and water, you may rinse your mouth with mouthwash if you wish. Do not swallow any toothpaste or mouthwash.  Use CHG Soap or wipes as directed on instruction sheet.   Do not wear lotions, powders, or perfumes.   Do not shave body hair from the neck down 48 hours before surgery.  Wear comfortable clothing (specific to your surgery type) to the hospital.  Do not wear jewelry, make-up, hairpins, clips or nail polish.  Contact lenses, hearing aids and dentures may not be worn into surgery.  Do not bring valuables to the hospital. [facility] is not responsible for any missing/lost belongings or valuables.   Notify your doctor if there is any change in your medical condition (cold, fever, infection).  If you are being discharged the day of surgery, you will not be allowed to drive home. You will need a responsible individual to drive you home and stay with you for 24 hours after surgery.   If you are taking public transportation, you will need to have a responsible individual with you.  If you are being admitted to the hospital overnight, leave your suitcase in the car. After surgery it may be brought to your room.  In case of increased patient census, it may be necessary for you, the patient, to continue your postoperative care in the Same Day Surgery department.  After surgery, you can help prevent lung complications by doing breathing exercises.  Take deep breaths and cough every 1-2 hours. Your  doctor may order a device called an Incentive Spirometer to help you take deep breaths. When coughing or sneezing, hold a pillow firmly against your incision with both hands. This is called "splinting." Doing this helps protect your incision. It also decreases belly discomfort.  Surgery Visitation Policy:  Patients undergoing a surgery or procedure may have two family members or support persons with  them as long as the person is not COVID-19 positive or experiencing its symptoms.   Inpatient Visitation:    Visiting hours are 7 a.m. to 8 p.m. Up to four visitors are allowed at one time in a patient room. The visitors may rotate out with other people during the day. One designated support person (adult) may remain overnight.  Please call the Pre-admissions Testing Dept. at [phone] if you have any questions about these instructions.

## 2022-08-24 NOTE — Telephone Encounter (Signed)
   Name: Brady Johnson  DOB: 1960-12-06  MRN: [id number]  Primary Cardiologist: Yvonne Kendall, MD  Chart reviewed as part of pre-operative protocol coverage. Because of Eland L Gettis's past medical history and time since last visit, he will require a follow-up telephone visit in order to better assess preoperative cardiovascular risk.  Pre-op covering staff: - Please schedule appointment and call patient to inform them. If patient already had an upcoming appointment within acceptable timeframe, please add "pre-op clearance" to the appointment notes so provider is aware. - Please contact requesting surgeon's office via preferred method (i.e, phone, fax) to inform them of need for appointment prior to surgery.  Okay to hold aspirin 5 to 7 days prior to the procedure.  Please resume medically safe to do so.  Sharlene Dory, PA-C  08/24/2022, 7:56 AM

## 2022-08-24 NOTE — Telephone Encounter (Signed)
It is okay to hold aspirin at the discretion of the surgical team.  Yvonne Kendall, MD [facility]

## 2022-08-24 NOTE — Progress Notes (Signed)
Perioperative / Anesthesia Services  Pre-Admission Testing Clinical Review / Preoperative Anesthesia Consult  Date: 08/25/22  Patient Demographics:  Name: Brady Johnson DOB:   1960/10/09 MRN:   [id number]  Planned Surgical Procedure(s):    Case: [id number] Date/Time: 08/26/22 0913   Procedure: LEFT L5-S1 MICRODISCECTOMY (Left)   Anesthesia type: General   Pre-op diagnosis:      M54.16 lumbar radiculopathy     R29.898 left leg weakness   Location: [facility] OR ROOM 03 / [facility]   Surgeons: Venetia Night, MD     NOTE: Available PAT nursing documentation and vital signs have been reviewed. Clinical nursing staff has updated patient's PMH/PSHx, current medication list, and drug allergies/intolerances to ensure comprehensive history available to assist in medical decision making as it pertains to the aforementioned surgical procedure and anticipated anesthetic course. Extensive review of available clinical information personally performed. [place] PMH and PSHx updated with any diagnoses/procedures that  may have been inadvertently omitted during his intake with the pre-admission testing department's nursing staff.  Clinical Discussion:  Brady Johnson is a 62 y.o. male who is submitted for pre-surgical anesthesia review and clearance prior to him undergoing the above procedure. Patient has never been a smoker. Pertinent PMH includes: CAD, PAD, NSVT, diastolic dysfunction, hypertrophic cardiomyopathy, cardiac murmur, ascending aorta dilatation, HTN, HLD, OSAH (non-compliant with PAP therapy), ED (on PDE5i).   Patient is followed by cardiology (End, MD). He was last seen in the cardiology clinic on 06/04/2022; notes reviewed. At the time of his clinic visit, patient doing well overall from a cardiovascular perspective. Patient complains of chronic exertional dyspnea associated with strenuous activity. Symptoms reported to be stable and at baseline. Patient denied any  chest pain, PND, orthopnea, palpitations, significant peripheral edema, weakness, fatigue, vertiginous symptoms, or presyncope/syncope. Patient with a past medical history significant for cardiovascular diagnoses. Documented physical exam was grossly benign, providing no evidence of acute exacerbation and/or decompensation of the patient's known cardiovascular conditions.  Patient underwent left heart catheterization on 03/29/2019 revealing no evidence of obstructive coronary artery disease.  There were minor luminal irregularities noted.  Recommendations were for medical management.  TTE performed on 05/12/2019 revealed moderate to severe LVH with the interventricular septum measuring 2 cm and the posterior wall measuring 1.5 cm.  Resting LVOT was approximately 15.  Findings concerning for possible HCM.  Subsequent cardiac MRI performed on 06/20/2019 revealed a hyperdynamic left ventricular systolic function with an EF of 68%; RVEF 79%.  There was asymmetrical LVH up to 20 mm in the basal anteroseptum (10 mm in the posterior wall), consistent with HCM.  Significant apical hypertrophy up to 8 mm in the apical inferior wall noted.  LVOT flow acceleration with peak resting gradient of 16.  Study demonstrated patchy late gadolinium enhancement (LGE) in the septum and apex.   Long-term cardiac event monitor study performed on 07/18/2019 demonstrated a predominant underlying sinus rhythm.  There were a total of 8 runs of PSVT with the longest lasting 12 beats at a maximum rate of 154 bpm.  Additionally there was a single run of NSVT lasting 7 beats at a maximum rate of 146 bpm.  Exercise tolerance test performed on 08/09/2020 revealed baseline sinus bradycardia with LVH and diffuse ST-T wave abnormalities consistent with HCM.  No significant arrhythmias observed.  Blood pressure demonstrated a normal response to exercise.  Patient demonstrated a good exercise capacity with blunted heart rate response. Target  heart rate was not achieved.  Study  was truncated due to shortness of breath.  Coronary CTA performed on 01/19/2022 revealed a coronary calcium score of 308, which was in the 86 percentile for age and sex matched control.  Study suggestive of 25 and 49% proximal LAD and proximal LCx territory stenosis/calcification.  Most recent TTE was performed on 02/04/2022 revealing a normal left ventricular systolic function with a hyperdynamic LVEF of 65 to 70%.  There was severe asymmetrical LVH of the septal segment.  Left ventricular diastolic Doppler parameters consistent with pseudonormalization (G2DD). Severe left atrial enlargement noted.  There was trivial aortic valve regurgitation.  All transvalvular gradients were noted to be normal providing no evidence suggestive of valvular stenosis.  Blood pressure reasonably controlled at 130/70 mmHg on currently prescribed CCB (amlodipine), diuretic (furosemide), ARB (losartan), and beta-blocker (metoprolol succinate) therapies.  Patient is on atorvastatin for his HLD diagnosis and ASCVD prevention.  Patient is not diabetic.  In the setting of known cardiovascular disease, it is important to note that patient is on a PDE5i (sildenafil) for erectile dysfunction diagnosis. Patient does have an OSAH diagnosis, however he is reported to be non-compliant with the prescribed nocturnal PAP therapy. Functional capacity, as defined by DASI, is documented as being >/= 4 METS. No changes was made to his medication regimen. Patient to follow up with outpatient cardiology in 6 months or sooner if needed.   Brady Johnson is scheduled for an LEFT L5-S1 MICRODISCECTOMY (Left) on 08/26/2022 with Dr. Venetia Night, MD.  Given patient's past medical history significant for cardiovascular diagnoses, presurgical cardiac clearance was sought by the PAT team. Per cardiology, "Brady Johnson's perioperative risk of a major cardiac event is 6.6% according to the Revised Cardiac Risk Index  (RCRI).  Therefore, he is at low risk for perioperative complications.  His functional capacity is fair at 4.73 METs according to the [employer] Activity Status Index (DASI). According to ACC/AHA guidelines, no further cardiovascular testing needed. The patient may proceed to surgery at ACCEPTABLE risk".    In review of his medication reconciliation, it is noted that patient is currently on prescribed daily antithrombotic therapy.  Given his cardiovascular history, neurosurgeon has advised that is acceptable to proceed with patient continuing his daily low-dose ASA therapy; patient aware.  Patient denies previous perioperative complications with anesthesia in the past. In review of the available records, it is noted that patient underwent a general anesthetic course here at [facility] (ASA III) in 05/2021 without documented complications.      08/24/2022   12:48 PM 08/18/2022   12:57 PM 08/16/2022    4:30 PM  Vitals with BMI  Height 5\' 8"  5\' 8"    Weight 209 lbs 209 lbs 6 oz   BMI 31.79 31.85   Systolic  140 141  Diastolic  74 89  Pulse   69    Providers/Specialists:   NOTE: Primary physician provider listed below. Patient may have been seen by APP or partner within same practice.   PROVIDER ROLE / SPECIALTY LAST Donalynn Furlong, MD Neurosurgery (Surgeon) 08/18/2022  Erasmo Downer, NP Primary Care Provider 08/21/2022  Yvonne Kendall, MD Cardiology 06/04/2022   Allergies:  Patient has no known allergies.  Current Home Medications:    Study - VICTORION-1 PREVENT - inclisiran 300 mg/1.81mL or placebo SQ injection (PI-Stuckey)    amLODipine (NORVASC) 10 MG tablet   aspirin EC 81 MG tablet   atorvastatin (LIPITOR) 20 MG tablet   colchicine 0.6 MG tablet  cyclobenzaprine (FLEXERIL) 10 MG tablet   furosemide (LASIX) 20 MG tablet   losartan (COZAAR) 100 MG tablet   meloxicam (MOBIC) 15 MG tablet   methylPREDNISolone (MEDROL DOSEPAK) 4 MG TBPK  tablet   metoprolol succinate (TOPROL-XL) 50 MG 24 hr tablet   oxyCODONE (OXY IR/ROXICODONE) 5 MG immediate release tablet   sildenafil (REVATIO) 20 MG tablet   Study - VICTORION-1 PREVENT - inclisiran 300 mg/1.65mL or placebo SQ injection (PI-Stuckey)   History:   Past Medical History:  Diagnosis Date   Ascending aorta dilatation (HCC)    a.) TTE 05/12/2019: Ao root 40 mm; b.) TTE 09/05/2019: Ao root 41 mm; c.) cCTA 01/19/2022: asc Ao 39 mm; d.) TTE 02/04/2022: Ao root 41 mm, asc Ao 40 mm   CAD (coronary artery disease)    a.) LHC 03/29/2019: minor luminal irregs; b.) cCTA 01/19/2022: Ca score 308 (86th percentile for age/sex match control); 25-49% pLAD and pLCx territories   Diastolic dysfunction    a.) TTE 05/12/2019: EF 65-70%, mod-sev LVH, mod LAE, mild resting LVOT of 15, G1DD; b.) TTE 09/05/2019: EF >75%, sev LVH, chordal SAM with no sig LVOT, mild LAE, triv AR/TR, G2DD; c.) TTE 02/04/2022: EF 65-70%, sev asymmetrical LVH of septal segment, sev LAE, triv AR, G2DD   Erectile dysfunction 05/14/2014   a.) on PDE5i (sildenafil)   Gout 04/13/2014   Heart murmur    Hyperandrogenism    Hyperlipidemia 04/13/2016   Hypertension 03/16/2014   Hypertrophic cardiomyopathy (HCC) 05/12/2019   a.) TTE 05/12/2019: mod-sev LVH (IVS 2 cm, post wall 1.5 cm), resting LVOT 15; b.) cMRI 06/20/2019: LVEF 68%, RVEF 79%. Asym LVH up to 20mm in basal anteroseptum (10mm in post wall), consistent with HCM. Sig apical hypertrophy up to 18mm in apical inf wall. LVOT flow acceleration with peak resting gradient 16. Patchy LGE in septum and apex; c.) TTE 09/05/2019: chordal SAM with no sig LVOT   Lumbar disc disease with radiculopathy    NSVT (nonsustained ventricular tachycardia) (HCC) 07/18/2019   a.) holter 07/18/2019: single run lasting 7 beats at a maximum rate of 146 bom   PAT (paroxysmal atrial tachycardia) 07/18/2019   a.) holter 07/18/2019: 8 atrial runs with the longest lasting 12 beats at a maximum rate  of 154 bpm   Sleep apnea    a.) non-compliant with prescribed nocturnal PAP therapy   Past Surgical History:  Procedure Laterality Date   CARDIAC CATHETERIZATION     CHOANAL ADENIODECTOMY     COLONOSCOPY N/A 05/26/2021   Procedure: COLONOSCOPY;  Surgeon: Regis Bill, MD;  Location: [facility] ENDOSCOPY;  Service: Endoscopy;  Laterality: N/A;   COLONOSCOPY WITH PROPOFOL N/A 05/13/2015   Procedure: COLONOSCOPY WITH PROPOFOL;  Surgeon: Christena Deem, MD;  Location: [facility] ENDOSCOPY;  Service: Endoscopy;  Laterality: N/A;   LEFT HEART CATH AND CORONARY ANGIOGRAPHY N/A 03/29/2019   Procedure: LEFT HEART CATH AND CORONARY ANGIOGRAPHY and possible PCI and stent;  Surgeon: Alwyn Pea, MD;  Location: [facility] INVASIVE CV LAB;  Service: Cardiovascular;  Laterality: N/A;   TONSILLECTOMY     TONSILLECTOMY AND ADENOIDECTOMY     Family History  Problem Relation Age of Onset   Heart attack Father 22   Heart attack Nephew 33   Heart failure Neg Hx    Sudden Cardiac Death Neg Hx    Social History   Tobacco Use   Smoking status: Never   Smokeless tobacco: Never  Vaping Use   Vaping Use: Never used  Substance Use  Topics   Alcohol use: No   Drug use: No    Pertinent Clinical Results:  LABS:    Lab Results  Component Value Date   WBC 11.7 (H) 08/16/2022   HGB 16.5 08/16/2022   HCT 47.8 08/16/2022   MCV 90.4 08/16/2022   PLT 228 08/16/2022   Lab Results  Component Value Date   NA 135 08/16/2022   K 4.0 08/16/2022   CO2 25 08/16/2022   GLUCOSE 129 (H) 08/16/2022   BUN 21 (H) 08/16/2022   CREATININE 1.11 08/16/2022   CALCIUM 9.2 08/16/2022   GFRNONAA >60 08/16/2022   Component Date Value Ref Range Status   MRSA, PCR 08/18/2022 NEGATIVE  NEGATIVE Final   Staphylococcus aureus 08/18/2022 NEGATIVE  NEGATIVE Final   Comment: (NOTE) The Xpert SA Assay (FDA approved for NASAL specimens in patients 79 years of age and older), is one component of a comprehensive surveillance  program. It is not intended to diagnose infection nor to guide or monitor treatment. Performed at [facility], [address] [place], [place] [address]    ABO/RH(D) 08/18/2022 O POS   Final   Antibody Screen 08/18/2022 NEG   Final   Sample Expiration 08/18/2022 09/01/2022,2359   Final   Extend sample reason 08/18/2022    Final                   Value:NO TRANSFUSIONS OR PREGNANCY IN THE PAST 3 MONTHS Performed at [facility], [address] [place], [place] [address]     ECG: Date: 08/09/2022 Time ECG obtained: 0921 AM Rate: 61 bpm Rhythm: normal sinus; LVH Axis (leads I and aVF): Normal Intervals: PR 157 ms. QRS 140 ms. QTc 475 ms. ST segment and T wave changes: No evidence of acute ST segment elevation or depression Comparison: Similar to previous tracing obtained on 06/04/2022   IMAGING / PROCEDURES: TRANSTHORACIC ECHOCARDIOGRAM performed on 02/04/2022 LV septal wall measures 1.8cm. findings consistent with HCM. no LVOT obstruction noted.. Left ventricular ejection fraction, by estimation, is 65 to 70%. The left ventricle has normal function. The left ventricle has no regional wall motion abnormalities. There is severe asymmetric left ventricular hypertrophy of the septal segment. Left ventricular diastolic parameters are consistent with Grade II diastolic dysfunction (pseudonormalization).  Right ventricular systolic function is normal. The right ventricular size is normal.  Left atrial size was severely dilated.  The mitral valve is normal in structure. No evidence of mitral valve regurgitation.  The aortic valve is tricuspid. Aortic valve regurgitation is trivial.  Aortic dilatation noted. There is mild dilatation of the aortic root, measuring 41 mm. There is mild dilatation of the ascending aorta, measuring 40 mm.  The inferior vena cava is normal in size with greater than 50% respiratory variability, suggesting right atrial pressure of 3 mmHg.   CT  CORONARY MORPH W/CTA COR W/SCORE W/CA W/CM &/OR WO/CM performed on 01/19/2022 Coronary calcium score of 308. This was 86th percentile for age and sex matched control. Normal coronary origin with left dominance. Calcified plaque causing mild proximal LAD and LCx stenosis (25-49%). Mild ascending aorta dilation, measuring 39 mm in max diameter. CAD-RADS 2. Mild non-obstructive CAD (25-49%). Consider non-atherosclerotic causes of chest pain. Consider preventive therapy and risk factor modification.   EXERCISE TOLERANCE TEST performed on 08/09/2020 Baseline EKG demonstrates sinus bradycardia with LVH and diffuse ST/T abnormalities consistent with LVH/HCM. The patient demonstrates good exercise capacity with blunted heart rate response. Target heart rate was not achieved. The  test was stopped due to shortness of breath. Appropriate blood pressure response noted to stress in the setting of known hypertrophic cardiomyopathy. No significant change in baseline ST/T abnormalities was observed There were no significant arrhythmias. Non-diagnostic exercise tolerance test for ischemia, given inability to reach target heart rate and baseline ST/T abnormalities.    MR CARD MORPHOLOGY WO/W CM performed on 06/20/2019 Asymmetric LV hypertrophy measuring up to 20mm in basal anteroseptum (10mm in posterior wall), consistent with hypertrophic cardiomyopathy. There is also significant apical  hypertrophy, measuring up to 18mm in apical inferior wall LVOT flow acceleration with peak resting gradient measured at 16 mmHg Patchy late gadolinium enhancement in septum and apex, consistent with HCM. LGE accounts for 4% of total myocardial mass Normal LV size and systolic function (EF 68%) Normal RV size and systolic function (EF 79%)  LEFT HEART CATHETERIZATION AND CORONARY ANGIOGRAPHY performed on 03/29/2019 Normal overall left ventricular function ejection fraction of 60% Left dominant system No significant  obstructive coronary disease just minor irregularities   Impression and Plan:  Brady Johnson has been referred for pre-anesthesia review and clearance prior to him undergoing the planned anesthetic and procedural courses. Available labs, pertinent testing, and imaging results were personally reviewed by me in preparation for upcoming operative/procedural course. [facility] medical record has been updated following extensive record review and patient interview with PAT staff.   This patient has been appropriately cleared by cardiology with an overall ACCEPTABLE risk of significant perioperative cardiovascular complications. Based on clinical review performed today (08/25/22), barring any significant acute changes in the patient's overall condition, it is anticipated that he will be able to proceed with the planned surgical intervention. Any acute changes in clinical condition may necessitate his procedure being postponed and/or cancelled. Patient will meet with anesthesia team (MD and/or CRNA) on the day of his procedure for preoperative evaluation/assessment. Questions regarding anesthetic course will be fielded at that time.   Pre-surgical instructions were reviewed with the patient during his PAT appointment, and questions were fielded to satisfaction by PAT clinical staff. He has been instructed on which medications that he will need to hold prior to surgery, as well as the ones that have been deemed safe/appropriate to take on the day of his procedure. As part of the general education provided by PAT, patient made aware both verbally and in writing, that he would need to abstain from the use of any illegal substances during his perioperative course.  He was advised that failure to follow the provided instructions could necessitate case cancellation or result in serious perioperative complications up to and including death. Patient encouraged to contact PAT and/or his surgeon's office to discuss any  questions or concerns that may arise prior to surgery; verbalized understanding.   Brady Mulling, MSN, APRN, FNP-C, CEN [facility]  Peri-operative Services Nurse Practitioner Phone: [phone] Fax: [phone] 08/25/22 11:39 AM  NOTE: This note has been prepared using Dragon dictation software. Despite my best ability to proofread, there is always the potential that unintentional transcriptional errors may still occur from this process.

## 2022-08-24 NOTE — Telephone Encounter (Signed)
Pt has been added on to pre op schedule today ok per Jari Favre, PAC pre op APP today. Med rec and consent are done.

## 2022-08-24 NOTE — Progress Notes (Signed)
Virtual Visit via Telephone Note   Because of Brady Johnson's co-morbid illnesses, he is at least at moderate risk for complications without adequate follow up.  This format is felt to be most appropriate for this patient at this time.  The patient did not have access to video technology/had technical difficulties with video requiring transitioning to audio format only (telephone).  All issues noted in this document were discussed and addressed.  No physical exam could be performed with this format.  Please refer to the patient's chart for his consent to telehealth for [facility].  Evaluation Performed:  Preoperative cardiovascular risk assessment _____________   Date:  08/24/2022   Patient ID:  Brady Johnson, DOB [birth year], MRN [id number] Patient Location:  Home Provider location:   Office  Primary Care Provider:  Erasmo Downer, NP Primary Cardiologist:  Yvonne Kendall, MD  Chief Complaint / Patient Profile   62 y.o. y/o male with a h/o hypertrophic cardiomyopathy (previously seen by genetics and EP), hypertension, hyperlipidemia, obstructive sleep apnea, and gout who is pending microdiscectomy and presents today for telephonic preoperative cardiovascular risk assessment.  History of Present Illness    Brady Johnson is a 62 y.o. male who presents via audio/video conferencing for a telehealth visit today.  Pt was last seen in cardiology clinic on 06/04/2022 by Dr. Okey Dupre.  At that time Brady Johnson was doing well .  The patient is now pending procedure as outlined above. Since his last visit, he tells me that about 2 weeks ago he had some tightness in his chest as well as shortness of breath.  He went to the ED and his EKG and blood work were normal.  He was told that he had an anxiety attack.  He has not had any symptoms since then.  Not recommended for him to follow-up with cardiology.  He states that due to his hip and leg pain that he is experiencing he has  not left the house in 7 days.  He would be able to go up a flight of stairs but cannot do any prolonged walking or standing.  For this reason he has scored barely above the 4.0 METS on the DASI.  He tells me he is already off his aspirin and meloxicam and has been off for about 7 days  Would recommend a 5 to 7-day aspirin hold. Patient is taking aspirin for nonobstructive CAD.   Past Medical History    Past Medical History:  Diagnosis Date   Erectile dysfunction 05/14/2014   Gout 04/13/2014   Heart attack (HCC) 2021   Heart murmur    Hyperandrogenism    Hyperlipidemia 04/13/2016   Hypertension 03/16/2014   Hypertrophic cardiomyopathy (HCC) 06/13/2019   Sleep apnea    Past Surgical History:  Procedure Laterality Date   CARDIAC CATHETERIZATION     CHOANAL ADENIODECTOMY     COLONOSCOPY N/A 05/26/2021   Procedure: COLONOSCOPY;  Surgeon: Regis Bill, MD;  Location: [facility] ENDOSCOPY;  Service: Endoscopy;  Laterality: N/A;   COLONOSCOPY WITH PROPOFOL N/A 05/13/2015   Procedure: COLONOSCOPY WITH PROPOFOL;  Surgeon: Christena Deem, MD;  Location: [facility] ENDOSCOPY;  Service: Endoscopy;  Laterality: N/A;   LEFT HEART CATH AND CORONARY ANGIOGRAPHY N/A 03/29/2019   Procedure: LEFT HEART CATH AND CORONARY ANGIOGRAPHY and possible PCI and stent;  Surgeon: Alwyn Pea, MD;  Location: [facility] INVASIVE CV LAB;  Service: Cardiovascular;  Laterality: N/A;   TONSILLECTOMY     TONSILLECTOMY AND  ADENOIDECTOMY      Allergies  No Known Allergies  Home Medications    Prior to Admission medications   Medication Sig Start Date End Date Taking? Authorizing Provider  amLODipine (NORVASC) 10 MG tablet TAKE 1 TABLET BY MOUTH EVERY DAY 06/04/22   End, Cristal Deer, MD  aspirin EC 81 MG tablet Take 81 mg by mouth daily.    [provider]  atorvastatin (LIPITOR) 20 MG tablet TAKE 1 TABLET BY MOUTH EVERY DAY 03/12/22   End, Cristal Deer, MD  colchicine 0.6 MG tablet Take 1 tablet by mouth  daily as needed.     [provider]  cyclobenzaprine (FLEXERIL) 10 MG tablet Take 10 mg by mouth 3 (three) times daily as needed. 08/11/22   [provider]  furosemide (LASIX) 20 MG tablet Take 1 tablet (20 mg total) by mouth daily. 03/12/22 03/07/23  End, Cristal Deer, MD  losartan (COZAAR) 100 MG tablet Take 100 mg by mouth daily. 03/28/19   [provider]  meloxicam (MOBIC) 15 MG tablet Take 15 mg by mouth daily.    [provider]  methylPREDNISolone (MEDROL DOSEPAK) 4 MG TBPK tablet Take 6 pills on day one then decrease by 1 pill each day Patient not taking: Reported on 08/24/2022 08/16/22   Faythe Ghee, PA-C  metoprolol succinate (TOPROL-XL) 50 MG 24 hr tablet TAKE 1 TABLET BY MOUTH EVERY DAY 06/22/22   End, Cristal Deer, MD  oxyCODONE (OXY IR/ROXICODONE) 5 MG immediate release tablet Take 5 mg by mouth every 4 (four) hours as needed for severe pain.    [provider]  sildenafil (REVATIO) 20 MG tablet Take 20 mg by mouth as needed. 08/14/19   [provider]  Study - VICTORION-1 PREVENT - inclisiran 300 mg/1.68mL or placebo SQ injection (PI-Stuckey) Inject 300 mg into the skin every 6 (six) months. For Investigational Use Only. Inject subcutaneously into the abdomen, upper arm or thigh every 6 months in clinic. Do not inject into areas of active skin disease,such as sun burns, skin rashes, inflammation, or skin infections. Please contact [facility] for any questions or concerns regarding this medication.    [provider]    Physical Exam    Vital Signs:  Brady Johnson does not have vital signs available for review today.  Given telephonic nature of communication, physical exam is limited. AAOx3. NAD. Normal affect.  Speech and respirations are unlabored.  Accessory Clinical Findings    None  Assessment & Plan    1.  Preoperative Cardiovascular Risk Assessment:  Brady Johnson perioperative risk of a major  cardiac event is 6.6% according to the Revised Cardiac Risk Index (RCRI).  Therefore, he is at low risk for perioperative complications.   His functional capacity is fair at 4.73 METs according to the [facility] Activity Status Index (DASI). Recommendations: According to ACC/AHA guidelines, no further cardiovascular testing needed.  The patient may proceed to surgery at acceptable risk.   Antiplatelet and/or Anticoagulation Recommendations: Aspirin can be held for 5-7 days prior to his surgery.  Please resume Aspirin post operatively when it is felt to be safe from a bleeding standpoint.   The patient was advised that if he develops new symptoms prior to surgery to contact our office to arrange for a follow-up visit, and he verbalized understanding.   A copy of this note will be routed to requesting surgeon.  Time:   Today, I have spent 6 minutes with the patient with telehealth technology discussing medical history, symptoms,  and management plan.     Sharlene Dory, PA-C  08/24/2022, 1:53 PM

## 2022-08-24 NOTE — Telephone Encounter (Signed)
Pt has been added on to pre op schedule today ok per Jari Favre, PAC pre op APP today. Med rec and consent are done.     Patient Consent for Virtual Visit        MITCHEAL RABBITT has provided verbal consent on 08/24/2022 for a virtual visit (video or telephone).   CONSENT FOR VIRTUAL VISIT FOR:  Brady Johnson  By participating in this virtual visit I agree to the following:  I hereby voluntarily request, consent and authorize [facility] and its employed or contracted physicians, physician assistants, nurse practitioners or other licensed health care professionals (the Practitioner), to provide me with telemedicine health care services (the "Services") as deemed necessary by the treating Practitioner. I acknowledge and consent to receive the Services by the Practitioner via telemedicine. I understand that the telemedicine visit will involve communicating with the Practitioner through live audiovisual communication technology and the disclosure of certain medical information by electronic transmission. I acknowledge that I have been given the opportunity to request an in-person assessment or other available alternative prior to the telemedicine visit and am voluntarily participating in the telemedicine visit.  I understand that I have the right to withhold or withdraw my consent to the use of telemedicine in the course of my care at any time, without affecting my right to future care or treatment, and that the Practitioner or I may terminate the telemedicine visit at any time. I understand that I have the right to inspect all information obtained and/or recorded in the course of the telemedicine visit and may receive copies of available information for a reasonable fee.  I understand that some of the potential risks of receiving the Services via telemedicine include:  Delay or interruption in medical evaluation due to technological equipment failure or disruption; Information  transmitted may not be sufficient (e.g. poor resolution of images) to allow for appropriate medical decision making by the Practitioner; and/or  In rare instances, security protocols could fail, causing a breach of personal health information.  Furthermore, I acknowledge that it is my responsibility to provide information about my medical history, conditions and care that is complete and accurate to the best of my ability. I acknowledge that Practitioner's advice, recommendations, and/or decision may be based on factors not within their control, such as incomplete or inaccurate data provided by me or distortions of diagnostic images or specimens that may result from electronic transmissions. I understand that the practice of medicine is not an exact science and that Practitioner makes no warranties or guarantees regarding treatment outcomes. I acknowledge that a copy of this consent can be made available to me via my patient portal [facility] MyChart), or I can request a printed copy by calling the office of [facility].    I understand that my insurance will be billed for this visit.   I have read or had this consent read to me. I understand the contents of this consent, which adequately explains the benefits and risks of the Services being provided via telemedicine.  I have been provided ample opportunity to ask questions regarding this consent and the Services and have had my questions answered to my satisfaction. I give my informed consent for the services to be provided through the use of telemedicine in my medical care

## 2022-08-25 ENCOUNTER — Encounter: Payer: Self-pay | Admitting: Neurosurgery

## 2022-08-26 ENCOUNTER — Encounter
Admission: RE | Disposition: A | Discharge status: Home / Self Care | Payer: Self-pay | Source: Ambulatory Visit | Attending: Neurosurgery

## 2022-08-26 ENCOUNTER — Ambulatory Visit: Payer: BC Managed Care – PPO | Admitting: Urgent Care

## 2022-08-26 ENCOUNTER — Ambulatory Visit: Payer: BC Managed Care – PPO

## 2022-08-26 ENCOUNTER — Other Ambulatory Visit: Payer: Self-pay

## 2022-08-26 ENCOUNTER — Encounter: Payer: Self-pay | Admitting: Neurosurgery

## 2022-08-26 ENCOUNTER — Ambulatory Visit
Admission: RE | Admit: 2022-08-26 | Discharge: 2022-08-26 | Disposition: A | Discharge status: Home / Self Care | Payer: BC Managed Care – PPO | Source: Ambulatory Visit | Attending: Neurosurgery | Admitting: Neurosurgery

## 2022-08-26 DIAGNOSIS — M5416 Radiculopathy, lumbar region: Secondary | ICD-10-CM

## 2022-08-26 DIAGNOSIS — Z79899 Other long term (current) drug therapy: Secondary | ICD-10-CM | POA: Diagnosis not present

## 2022-08-26 DIAGNOSIS — G4730 Sleep apnea, unspecified: Secondary | ICD-10-CM | POA: Diagnosis not present

## 2022-08-26 DIAGNOSIS — Z7952 Long term (current) use of systemic steroids: Secondary | ICD-10-CM | POA: Insufficient documentation

## 2022-08-26 DIAGNOSIS — R29898 Other symptoms and signs involving the musculoskeletal system: Secondary | ICD-10-CM

## 2022-08-26 DIAGNOSIS — E785 Hyperlipidemia, unspecified: Secondary | ICD-10-CM | POA: Insufficient documentation

## 2022-08-26 DIAGNOSIS — Z01812 Encounter for preprocedural laboratory examination: Secondary | ICD-10-CM

## 2022-08-26 DIAGNOSIS — M109 Gout, unspecified: Secondary | ICD-10-CM | POA: Insufficient documentation

## 2022-08-26 DIAGNOSIS — I10 Essential (primary) hypertension: Secondary | ICD-10-CM | POA: Insufficient documentation

## 2022-08-26 DIAGNOSIS — Z791 Long term (current) use of non-steroidal anti-inflammatories (NSAID): Secondary | ICD-10-CM | POA: Insufficient documentation

## 2022-08-26 DIAGNOSIS — I422 Other hypertrophic cardiomyopathy: Secondary | ICD-10-CM | POA: Insufficient documentation

## 2022-08-26 DIAGNOSIS — Z01818 Encounter for other preprocedural examination: Secondary | ICD-10-CM

## 2022-08-26 DIAGNOSIS — Z7982 Long term (current) use of aspirin: Secondary | ICD-10-CM | POA: Insufficient documentation

## 2022-08-26 DIAGNOSIS — M5117 Intervertebral disc disorders with radiculopathy, lumbosacral region: Secondary | ICD-10-CM | POA: Insufficient documentation

## 2022-08-26 HISTORY — DX: Other ill-defined heart diseases: I51.89

## 2022-08-26 HISTORY — DX: Atherosclerotic heart disease of native coronary artery without angina pectoris: I25.10

## 2022-08-26 HISTORY — DX: Intervertebral disc disorders with radiculopathy, lumbar region: M51.16

## 2022-08-26 HISTORY — DX: Thoracic aortic ectasia: I77.810

## 2022-08-26 HISTORY — PX: LUMBAR LAMINECTOMY/DECOMPRESSION MICRODISCECTOMY: SHX5026

## 2022-08-26 LAB — ABO/RH: ABO/RH(D): O POS

## 2022-08-26 SURGERY — LUMBAR LAMINECTOMY/DECOMPRESSION MICRODISCECTOMY 1 LEVEL
Anesthesia: General | Site: Spine Lumbar | Laterality: Left

## 2022-08-26 MED ORDER — ONDANSETRON HCL 4 MG/2ML IJ SOLN
INTRAMUSCULAR | Status: AC
  Filled 2022-08-26: qty 2

## 2022-08-26 MED ORDER — HYDROMORPHONE HCL 1 MG/ML IJ SOLN
INTRAMUSCULAR | Status: AC
  Filled 2022-08-26: qty 1

## 2022-08-26 MED ORDER — FAMOTIDINE 20 MG PO TABS
ORAL_TABLET | ORAL | Status: AC
  Filled 2022-08-26: qty 1

## 2022-08-26 MED ORDER — FENTANYL CITRATE (PF) 100 MCG/2ML IJ SOLN: 25.0000 ug | INTRAMUSCULAR | Status: DC | PRN

## 2022-08-26 MED ORDER — METHYLPREDNISOLONE ACETATE 40 MG/ML IJ SUSP
INTRAMUSCULAR | Status: AC
  Filled 2022-08-26: qty 1

## 2022-08-26 MED ORDER — KETAMINE HCL 10 MG/ML IJ SOLN
INTRAMUSCULAR | Status: DC | PRN
  Administered 2022-08-26: 40 mg via INTRAVENOUS

## 2022-08-26 MED ORDER — DROPERIDOL 2.5 MG/ML IJ SOLN: 0.6250 mg | Freq: Once | INTRAMUSCULAR | Status: DC | PRN

## 2022-08-26 MED ORDER — PROPOFOL 10 MG/ML IV BOLUS
INTRAVENOUS | Status: DC | PRN
  Administered 2022-08-26: 150 mg via INTRAVENOUS

## 2022-08-26 MED ORDER — SUCCINYLCHOLINE CHLORIDE 200 MG/10ML IV SOSY
PREFILLED_SYRINGE | INTRAVENOUS | Status: DC | PRN
  Administered 2022-08-26: 120 mg via INTRAVENOUS

## 2022-08-26 MED ORDER — LIDOCAINE HCL (PF) 2 % IJ SOLN
INTRAMUSCULAR | Status: AC
  Filled 2022-08-26: qty 5

## 2022-08-26 MED ORDER — DEXAMETHASONE SODIUM PHOSPHATE 10 MG/ML IJ SOLN
INTRAMUSCULAR | Status: AC
  Filled 2022-08-26: qty 1

## 2022-08-26 MED ORDER — PROPOFOL 10 MG/ML IV BOLUS
INTRAVENOUS | Status: AC
  Filled 2022-08-26: qty 20

## 2022-08-26 MED ORDER — CHLORHEXIDINE GLUCONATE 0.12 % MT SOLN
15.0000 mL | Freq: Once | OROMUCOSAL | Status: AC
  Administered 2022-08-26: 15 mL via OROMUCOSAL

## 2022-08-26 MED ORDER — BUPIVACAINE-EPINEPHRINE (PF) 0.5% -1:200000 IJ SOLN
INTRAMUSCULAR | Status: DC | PRN
  Administered 2022-08-26: 4 mL

## 2022-08-26 MED ORDER — OXYCODONE HCL 5 MG PO TABS: 5.0000 mg | ORAL_TABLET | ORAL | 0 refills | Status: DC | PRN

## 2022-08-26 MED ORDER — ORAL CARE MOUTH RINSE: 15.0000 mL | Freq: Once | OROMUCOSAL | Status: AC

## 2022-08-26 MED ORDER — HYDROMORPHONE HCL 1 MG/ML IJ SOLN
INTRAMUSCULAR | Status: DC | PRN
  Administered 2022-08-26 (×2): .5 mg via INTRAVENOUS

## 2022-08-26 MED ORDER — SODIUM CHLORIDE FLUSH 0.9 % IV SOLN
INTRAVENOUS | Status: AC
  Filled 2022-08-26: qty 20

## 2022-08-26 MED ORDER — DEXAMETHASONE SODIUM PHOSPHATE 10 MG/ML IJ SOLN
INTRAMUSCULAR | Status: DC | PRN
  Administered 2022-08-26: 10 mg via INTRAVENOUS

## 2022-08-26 MED ORDER — CEFAZOLIN IN SODIUM CHLORIDE 2-0.9 GM/100ML-% IV SOLN
2.0000 g | Freq: Once | INTRAVENOUS | Status: DC
  Filled 2022-08-26: qty 100

## 2022-08-26 MED ORDER — SUCCINYLCHOLINE CHLORIDE 200 MG/10ML IV SOSY
PREFILLED_SYRINGE | INTRAVENOUS | Status: AC
  Filled 2022-08-26: qty 10

## 2022-08-26 MED ORDER — KETAMINE HCL 50 MG/5ML IJ SOSY
PREFILLED_SYRINGE | INTRAMUSCULAR | Status: AC
  Filled 2022-08-26: qty 5

## 2022-08-26 MED ORDER — PHENYLEPHRINE 80 MCG/ML (10ML) SYRINGE FOR IV PUSH (FOR BLOOD PRESSURE SUPPORT)
PREFILLED_SYRINGE | INTRAVENOUS | Status: DC | PRN
  Administered 2022-08-26: 160 ug via INTRAVENOUS
  Administered 2022-08-26 (×2): 80 ug via INTRAVENOUS

## 2022-08-26 MED ORDER — ONDANSETRON HCL 4 MG/2ML IJ SOLN
INTRAMUSCULAR | Status: DC | PRN
  Administered 2022-08-26: 4 mg via INTRAVENOUS

## 2022-08-26 MED ORDER — BUPIVACAINE LIPOSOME 1.3 % IJ SUSP
INTRAMUSCULAR | Status: AC
  Filled 2022-08-26: qty 20

## 2022-08-26 MED ORDER — FAMOTIDINE 20 MG PO TABS
20.0000 mg | ORAL_TABLET | Freq: Once | ORAL | Status: AC
  Administered 2022-08-26: 20 mg via ORAL

## 2022-08-26 MED ORDER — CEFAZOLIN SODIUM-DEXTROSE 2-4 GM/100ML-% IV SOLN
2.0000 g | INTRAVENOUS | Status: AC
  Administered 2022-08-26: 2 g via INTRAVENOUS

## 2022-08-26 MED ORDER — VASOPRESSIN 20 UNIT/ML IV SOLN
INTRAVENOUS | Status: DC | PRN
  Administered 2022-08-26: 2 [IU] via INTRAVENOUS

## 2022-08-26 MED ORDER — 0.9 % SODIUM CHLORIDE (POUR BTL) OPTIME
TOPICAL | Status: DC | PRN
  Administered 2022-08-26: 500 mL

## 2022-08-26 MED ORDER — ROCURONIUM BROMIDE 10 MG/ML (PF) SYRINGE
PREFILLED_SYRINGE | INTRAVENOUS | Status: AC
  Filled 2022-08-26: qty 10

## 2022-08-26 MED ORDER — MIDAZOLAM HCL 2 MG/2ML IJ SOLN
INTRAMUSCULAR | Status: AC
  Filled 2022-08-26: qty 2

## 2022-08-26 MED ORDER — BUPIVACAINE HCL (PF) 0.5 % IJ SOLN
INTRAMUSCULAR | Status: AC
  Filled 2022-08-26: qty 60

## 2022-08-26 MED ORDER — CEFAZOLIN SODIUM-DEXTROSE 2-4 GM/100ML-% IV SOLN
INTRAVENOUS | Status: AC
  Filled 2022-08-26: qty 100

## 2022-08-26 MED ORDER — LACTATED RINGERS IV SOLN: INTRAVENOUS | Status: DC

## 2022-08-26 MED ORDER — ACETAMINOPHEN 10 MG/ML IV SOLN
INTRAVENOUS | Status: AC
  Filled 2022-08-26: qty 100

## 2022-08-26 MED ORDER — MIDAZOLAM HCL 2 MG/2ML IJ SOLN
INTRAMUSCULAR | Status: DC | PRN
  Administered 2022-08-26: 2 mg via INTRAVENOUS

## 2022-08-26 MED ORDER — ACETAMINOPHEN 10 MG/ML IV SOLN
INTRAVENOUS | Status: DC | PRN
  Administered 2022-08-26: 1000 mg via INTRAVENOUS

## 2022-08-26 MED ORDER — SURGIFLO WITH THROMBIN (HEMOSTATIC MATRIX KIT) OPTIME
TOPICAL | Status: DC | PRN
  Administered 2022-08-26: 1 via TOPICAL

## 2022-08-26 MED ORDER — KETOROLAC TROMETHAMINE 30 MG/ML IJ SOLN
INTRAMUSCULAR | Status: AC
  Filled 2022-08-26: qty 1

## 2022-08-26 MED ORDER — CYCLOBENZAPRINE HCL 10 MG PO TABS: 10.0000 mg | ORAL_TABLET | Freq: Three times a day (TID) | ORAL | 0 refills | Status: DC | PRN

## 2022-08-26 MED ORDER — PHENYLEPHRINE 80 MCG/ML (10ML) SYRINGE FOR IV PUSH (FOR BLOOD PRESSURE SUPPORT)
PREFILLED_SYRINGE | INTRAVENOUS | Status: AC
  Filled 2022-08-26: qty 10

## 2022-08-26 MED ORDER — SENNA 8.6 MG PO TABS: 1.0000 | ORAL_TABLET | Freq: Every day | ORAL | 0 refills | Status: DC | PRN

## 2022-08-26 MED ORDER — CHLORHEXIDINE GLUCONATE 0.12 % MT SOLN
OROMUCOSAL | Status: AC
  Filled 2022-08-26: qty 15

## 2022-08-26 MED ORDER — PHENYLEPHRINE HCL-NACL 20-0.9 MG/250ML-% IV SOLN
INTRAVENOUS | Status: DC | PRN
  Administered 2022-08-26: 50 ug/min via INTRAVENOUS

## 2022-08-26 MED ORDER — METHYLPREDNISOLONE ACETATE 40 MG/ML IJ SUSP
INTRAMUSCULAR | Status: DC | PRN
  Administered 2022-08-26: 40 mg

## 2022-08-26 MED ORDER — LIDOCAINE HCL (CARDIAC) PF 100 MG/5ML IV SOSY
PREFILLED_SYRINGE | INTRAVENOUS | Status: DC | PRN
  Administered 2022-08-26: 100 mg via INTRAVENOUS

## 2022-08-26 MED ORDER — PHENYLEPHRINE HCL-NACL 20-0.9 MG/250ML-% IV SOLN
INTRAVENOUS | Status: AC
  Filled 2022-08-26: qty 250

## 2022-08-26 MED ORDER — VASOPRESSIN 20 UNIT/ML IV SOLN
INTRAVENOUS | Status: AC
  Filled 2022-08-26: qty 1

## 2022-08-26 MED ORDER — KETOROLAC TROMETHAMINE 30 MG/ML IJ SOLN
INTRAMUSCULAR | Status: DC | PRN
  Administered 2022-08-26: 30 mg via INTRAVENOUS

## 2022-08-26 MED ORDER — DEXMEDETOMIDINE HCL IN NACL 80 MCG/20ML IV SOLN
INTRAVENOUS | Status: DC | PRN
  Administered 2022-08-26 (×3): 8 ug via INTRAVENOUS

## 2022-08-26 MED ORDER — SODIUM CHLORIDE (PF) 0.9 % IJ SOLN
INTRAMUSCULAR | Status: DC | PRN
  Administered 2022-08-26: 60 mL via INTRAMUSCULAR

## 2022-08-26 SURGICAL SUPPLY — 42 items
ADH SKN CLS APL DERMABOND .7 (GAUZE/BANDAGES/DRESSINGS) ×1
AGENT HMST KT MTR STRL THRMB (HEMOSTASIS) ×1
BASIN KIT SINGLE STR (MISCELLANEOUS) ×1 IMPLANT
BUR NEURO DRILL SOFT 3.0X3.8M (BURR) ×1 IMPLANT
CNTNR URN SCR LID CUP LEK RST (MISCELLANEOUS) ×1 IMPLANT
CONT SPEC 4OZ STRL OR WHT (MISCELLANEOUS) ×1
DERMABOND ADVANCED .7 DNX12 (GAUZE/BANDAGES/DRESSINGS) ×1 IMPLANT
DRAPE C ARM PK CFD 31 SPINE (DRAPES) ×1 IMPLANT
DRAPE LAPAROTOMY 100X77 ABD (DRAPES) ×1 IMPLANT
DRAPE MICROSCOPE SPINE 48X150 (DRAPES) ×1 IMPLANT
DRSG OPSITE POSTOP 3X4 (GAUZE/BANDAGES/DRESSINGS) IMPLANT
ELECT EZSTD 165MM 6.5IN (MISCELLANEOUS) ×1
ELECT REM PT RETURN 9FT ADLT (ELECTROSURGICAL) ×1
ELECTRODE EZSTD 165MM 6.5IN (MISCELLANEOUS) ×1 IMPLANT
ELECTRODE REM PT RTRN 9FT ADLT (ELECTROSURGICAL) ×1 IMPLANT
GLOVE BIOGEL PI IND STRL 6.5 (GLOVE) ×1 IMPLANT
GLOVE SURG SYN 6.5 ES PF (GLOVE) ×1 IMPLANT
GLOVE SURG SYN 6.5 PF PI (GLOVE) ×1 IMPLANT
GLOVE SURG SYN 8.5  E (GLOVE) ×3
GLOVE SURG SYN 8.5 E (GLOVE) ×3 IMPLANT
GLOVE SURG SYN 8.5 PF PI (GLOVE) ×3 IMPLANT
GOWN SRG LRG LVL 4 IMPRV REINF (GOWNS) ×1 IMPLANT
GOWN SRG XL LVL 3 NONREINFORCE (GOWNS) ×1 IMPLANT
GOWN STRL NON-REIN TWL XL LVL3 (GOWNS) ×1
GOWN STRL REIN LRG LVL4 (GOWNS) ×1
KIT SPINAL PRONEVIEW (KITS) ×1 IMPLANT
MANIFOLD NEPTUNE II (INSTRUMENTS) ×1 IMPLANT
MARKER SKIN DUAL TIP RULER LAB (MISCELLANEOUS) ×1 IMPLANT
NDL SAFETY ECLIP 18X1.5 (MISCELLANEOUS) ×1 IMPLANT
NS IRRIG 1000ML POUR BTL (IV SOLUTION) ×1 IMPLANT
NS IRRIG 500ML POUR BTL (IV SOLUTION) IMPLANT
PACK LAMINECTOMY NEURO (CUSTOM PROCEDURE TRAY) ×1 IMPLANT
SURGIFLO W/THROMBIN 8M KIT (HEMOSTASIS) ×1 IMPLANT
SUT DVC VLOC 3-0 CL 6 P-12 (SUTURE) ×1 IMPLANT
SUT VIC AB 0 CT1 27 (SUTURE) ×1
SUT VIC AB 0 CT1 27XCR 8 STRN (SUTURE) ×1 IMPLANT
SUT VIC AB 2-0 CT1 18 (SUTURE) ×1 IMPLANT
SYR 30ML LL (SYRINGE) ×2 IMPLANT
SYR 3ML LL SCALE MARK (SYRINGE) ×1 IMPLANT
TRAP FLUID SMOKE EVACUATOR (MISCELLANEOUS) ×1 IMPLANT
WATER STERILE IRR 1000ML POUR (IV SOLUTION) ×2 IMPLANT
WATER STERILE IRR 500ML POUR (IV SOLUTION) IMPLANT

## 2022-08-26 NOTE — Op Note (Signed)
Indications: Mr. Brady Johnson is suffering from M54.16 lumbar radiculopathy, R29.898 left leg weakness. The patient tried and failed conservative management, prompting surgical intervention.  Findings: disc herniation L5/S1  Preoperative Diagnosis: M54.16 lumbar radiculopathy, R29.898 left leg weakness  Postoperative Diagnosis: same   EBL: 10 ml IVF: see AR ml Drains: none Disposition: Extubated and Stable to PACU Complications: none  No foley catheter was placed.   Preoperative Note:   Risks of surgery discussed include: infection, bleeding, stroke, coma, death, paralysis, CSF leak, nerve/spinal cord injury, numbness, tingling, weakness, complex regional pain syndrome, recurrent stenosis and/or disc herniation, vascular injury, development of instability, neck/back pain, need for further surgery, persistent symptoms, development of deformity, and the risks of anesthesia. The patient understood these risks and agreed to proceed.  Operative Note:   1) Left L5/S1 microdiscectomy  The patient was then brought from the preoperative center with intravenous access established.  The patient underwent general anesthesia and endotracheal tube intubation, and was then rotated on the Evant rail top where all pressure points were appropriately padded.  The skin was then thoroughly cleansed.  Perioperative antibiotic prophylaxis was administered.  Sterile prep and drapes were then applied and a timeout was then observed.  C-arm was brought into the field under sterile conditions, and the L5-S1 disc space identified and marked with an incision on the left 1cm lateral to midline.  Once this was complete a 2 cm incision was opened with the use of a #10 blade knife.  The Metrx tubes were sequentially advanced under lateral fluoroscopy until a 18 x 40 mm Metrx tube was placed over the facet and lamina and secured to the bed.    The microscope was then sterilely brought into the field and muscle creep  was hemostased with a bipolar and resected with a pituitary rongeur.  A Bovie extender was then used to expose the spinous process and lamina.  Careful attention was placed to not violate the facet capsule. A 3 mm matchstick drill bit was then used to make a hemi-laminotomy trough until the ligamentum flavum was exposed.  This was extended to the base of the spinous process.  Once this was complete and the underlying ligamentum flavum was visualized, the ligamentum was dissected with an up angle curette and resected with a #2 and #3 mm biting Kerrison.  The laminotomy opening was also expanded in similar fashion and hemostasis was obtained with Surgifoam and a patty as well as bone wax.  The rostral aspect of the caudal level of the lamina was also resected with a #2 biting Kerrison effort to further enhance exposure.  Once the underlying dura was visualized a Penfield 4 was then used to dissect and expose the traversing nerve root.  Once this was identified a nerve root retractor suction was used to mobilize this medially.  The venous plexus was hemostased with Surgifoam and light bipolar use.  A small penfied was then used to make a small annulotomy within the disc space and disc space contents were noted to come through the annulus.    The disc herniation was identified and dissected free using a balltip probe. The pituitary rongeur was used to remove the extruded disc fragments. Once the thecal sac and nerve root were noted to be relaxed and under less tension the ball-tipped feeler was passed along the foramen distally to ensure no residual compression was noted.    Depo-Medrol was placed along the nerve root.  The area was irrigated. The tube system was  then removed under microscopic visualization and hemostasis was obtained with a bipolar.    The fascial layer was reapproximated with the use of a 0- Vicryl suture.  Subcutaneous tissue layer was reapproximated using 2-0 Vicryl suture.  3-0 monocryl was  used on the skin. The skin was then cleansed and Dermabond was used to close the skin opening.  Patient was then rotated back to the preoperative bed awakened from anesthesia and taken to recovery all counts are correct in this case.  I performed the entire procedure with the assistance of Manning Charity PA as an Designer, television/film set. An assistant was required for this procedure due to the complexity.  The assistant provided assistance in tissue manipulation and suction, and was required for the successful and safe performance of the procedure. I performed the critical portions of the procedure.   Venetia Night MD

## 2022-08-26 NOTE — Anesthesia Preprocedure Evaluation (Signed)
Anesthesia Evaluation  Patient identified by MRN, date of birth, ID band Patient awake    Reviewed: Allergy & Precautions, H&P , NPO status , Patient's Chart, lab work & pertinent test results, reviewed documented beta blocker date and time   History of Anesthesia Complications Negative for: history of anesthetic complications  Airway Mallampati: II  TM Distance: >3 FB Neck ROM: full    Dental  (+) Caps, Dental Advidsory Given, Teeth Intact, Missing   Pulmonary neg shortness of breath, sleep apnea , neg COPD, neg recent URI   Pulmonary exam normal breath sounds clear to auscultation       Cardiovascular Exercise Tolerance: Good hypertension, (-) angina + CAD  (-) Past MI and (-) Cardiac Stents Normal cardiovascular exam+ dysrhythmias + Valvular Problems/Murmurs  Rhythm:regular Rate:Normal     Neuro/Psych negative neurological ROS  negative psych ROS   GI/Hepatic negative GI ROS, Neg liver ROS,,,  Endo/Other  negative endocrine ROS    Renal/GU negative Renal ROS  negative genitourinary   Musculoskeletal   Abdominal   Peds  Hematology negative hematology ROS (+)   Anesthesia Other Findings Past Medical History: No date: Ascending aorta dilatation (HCC)     Comment:  a.) TTE 05/12/2019: Ao root 40 mm; b.) TTE 09/05/2019:               Ao root 41 mm; c.) cCTA 01/19/2022: asc Ao 39 mm; d.) TTE              02/04/2022: Ao root 41 mm, asc Ao 40 mm No date: CAD (coronary artery disease)     Comment:  a.) LHC 03/29/2019: minor luminal irregs; b.) cCTA               01/19/2022: Ca score 308 (86th percentile for age/sex               match control); 25-49% pLAD and pLCx territories No date: Diastolic dysfunction     Comment:  a.) TTE 05/12/2019: EF 65-70%, mod-sev LVH, mod LAE,               mild resting LVOT of 15, G1DD; b.) TTE 09/05/2019: EF               >75%, sev LVH, chordal SAM with no sig LVOT, mild LAE,                triv AR/TR, G2DD; c.) TTE 02/04/2022: EF 65-70%, sev               asymmetrical LVH of septal segment, sev LAE, triv AR,               G2DD 05/14/2014: Erectile dysfunction     Comment:  a.) on PDE5i (sildenafil) 04/13/2014: Gout No date: Heart murmur No date: Hyperandrogenism 04/13/2016: Hyperlipidemia 03/16/2014: Hypertension 05/12/2019: Hypertrophic cardiomyopathy (HCC)     Comment:  a.) TTE 05/12/2019: mod-sev LVH (IVS 2 cm, post wall 1.5              cm), resting LVOT 15; b.) cMRI 06/20/2019: LVEF 68%, RVEF              79%. Asym LVH up to 20mm in basal anteroseptum (10mm in               post wall), consistent with HCM. Sig apical hypertrophy               up to 18mm in apical inf wall. LVOT flow acceleration  with peak resting gradient 16. Patchy LGE in septum and               apex; c.) TTE 09/05/2019: chordal SAM with no sig LVOT No date: Lumbar disc disease with radiculopathy 07/18/2019: NSVT (nonsustained ventricular tachycardia) (HCC)     Comment:  a.) holter 07/18/2019: single run lasting 7 beats at a               maximum rate of 146 bom 07/18/2019: PAT (paroxysmal atrial tachycardia)     Comment:  a.) holter 07/18/2019: 8 atrial runs with the longest               lasting 12 beats at a maximum rate of 154 bpm No date: Sleep apnea     Comment:  a.) non-compliant with prescribed nocturnal PAP therapy   Reproductive/Obstetrics negative OB ROS                             Anesthesia Physical Anesthesia Plan  ASA: 2  Anesthesia Plan: General   Post-op Pain Management:    Induction: Intravenous  PONV Risk Score and Plan: 2 and Ondansetron, Dexamethasone, Midazolam and Treatment may vary due to age or medical condition  Airway Management Planned: Oral ETT  Additional Equipment:   Intra-op Plan:   Post-operative Plan: Extubation in OR  Informed Consent: I have reviewed the patients History and Physical, chart, labs  and discussed the procedure including the risks, benefits and alternatives for the proposed anesthesia with the patient or authorized representative who has indicated his/her understanding and acceptance.     Dental Advisory Given  Plan Discussed with: Anesthesiologist, CRNA and Surgeon  Anesthesia Plan Comments:        Anesthesia Quick Evaluation

## 2022-08-26 NOTE — TOC Progression Note (Signed)
Transition of Care [facility]) - Progression Note    Patient Details  Name: Brady Johnson MRN: [id number] Date of Birth: [birth year]  [facility] CM/SW Contact  Marlowe Sax, RN Phone Number: 08/26/2022, 10:27 AM  Clinical Narrative:    Per D yarbrough no HH needs        Expected Discharge Plan and Services         Expected Discharge Date: 08/26/22                                     Social Determinants of Health (SDOH) Interventions SDOH Screenings   Tobacco Use: Low Risk  (08/26/2022)    Readmission Risk Interventions     No data to display

## 2022-08-26 NOTE — Anesthesia Procedure Notes (Signed)
Procedure Name: Intubation Date/Time: 08/26/2022 9:15 AM  Performed by: Katherine Basset, CRNAPre-anesthesia Checklist: Patient identified, Emergency Drugs available, Suction available and Patient being monitored Patient Re-evaluated:Patient Re-evaluated prior to induction Oxygen Delivery Method: Circle system utilized Preoxygenation: Pre-oxygenation with 100% oxygen Induction Type: IV induction Laryngoscope Size: Miller and 3 Grade View: Grade II Tube type: Oral Tube size: 7.5 mm Number of attempts: 1 Airway Equipment and Method: Stylet, Oral airway, LTA kit utilized and Bite block Placement Confirmation: ETT inserted through vocal cords under direct vision, positive ETCO2 and breath sounds checked- equal and bilateral Secured at: 22 cm Tube secured with: Tape Dental Injury: Teeth and Oropharynx as per pre-operative assessment

## 2022-08-26 NOTE — Transfer of Care (Signed)
Immediate Anesthesia Transfer of Care Note  Patient: Brady Johnson  Procedure(s) Performed: LEFT L5-S1 MICRODISCECTOMY (Left: Spine Lumbar)  Patient Location: PACU  Anesthesia Type:General  Level of Consciousness: drowsy  Airway & Oxygen Therapy: Patient Spontanous Breathing and Patient connected to face mask oxygen  Post-op Assessment: Report given to RN, Post -op Vital signs reviewed and stable, and Patient moving all extremities  Post vital signs: Reviewed and stable  Last Vitals:  Vitals Value Taken Time  BP 99/67 08/26/22 1020  Temp    Pulse 63 08/26/22 1024  Resp 11 08/26/22 1024  SpO2 100 % 08/26/22 1024  Vitals shown include unvalidated device data.  Last Pain:  Vitals:   08/26/22 0757  TempSrc: Oral  PainSc: 9          Complications: No notable events documented.

## 2022-08-26 NOTE — Discharge Instructions (Addendum)
Your surgeon has performed an operation on your lumbar spine (low back) to relieve pressure on one or more nerves. Many times, patients feel better immediately after surgery and can "overdo it." Even if you feel well, it is important that you follow these activity guidelines. If you do not let your back heal properly from the surgery, you can increase the chance of a disc herniation and/or return of your symptoms. The following are instructions to help in your recovery once you have been discharged from the hospital.  * It is ok to take NSAIDs after surgery.  Activity    No bending, lifting, or twisting ("BLT"). Avoid lifting objects heavier than 10 pounds (gallon milk jug).  Where possible, avoid household activities that involve lifting, bending, pushing, or pulling such as laundry, vacuuming, grocery shopping, and childcare. Try to arrange for help from friends and family for these activities while your back heals.  Increase physical activity slowly as tolerated.  Taking short walks is encouraged, but avoid strenuous exercise. Do not jog, run, bicycle, lift weights, or participate in any other exercises unless specifically allowed by your doctor. Avoid prolonged sitting, including car rides.  Talk to your doctor before resuming sexual activity.  You should not drive until cleared by your doctor.  Until released by your doctor, you should not return to work or school.  You should rest at home and let your body heal.   You may shower three days after your surgery.  After showering, lightly dab your incision dry. Do not take a tub bath or go swimming for 3 weeks, or until approved by your doctor at your follow-up appointment.  If you smoke, we strongly recommend that you quit.  Smoking has been proven to interfere with normal healing in your back and will dramatically reduce the success rate of your surgery. Please contact [facility] (800-QUIT-NOW) and use the resources at www.[facility].com for  assistance in stopping smoking.  Surgical Incision   If you have a dressing on your incision, you may remove it three days after your surgery. Keep your incision area clean and dry.  If you have staples or stitches on your incision, you should have a follow up scheduled for removal. If you do not have staples or stitches, you will have steri-strips (small pieces of surgical tape) or Dermabond glue. The steri-strips/glue should begin to peel away within about a week (it is fine if the steri-strips fall off before then). If the strips are still in place one week after your surgery, you may gently remove them.  Diet            You may return to your usual diet. Be sure to stay hydrated.  When to Contact Us  Although your surgery and recovery will likely be uneventful, you may have some residual numbness, aches, and pains in your back and/or legs. This is normal and should improve in the next few weeks.  However, should you experience any of the following, contact us immediately: New numbness or weakness Pain that is progressively getting worse, and is not relieved by your pain medications or rest Bleeding, redness, swelling, pain, or drainage from surgical incision Chills or flu-like symptoms Fever greater than 101.0 F (38.3 C) Problems with bowel or bladder functions Difficulty breathing or shortness of breath Warmth, tenderness, or swelling in your calf  Contact Information During office hours (Monday-Friday 9 am to 5 pm), please call your physician at [phone] and ask for Kendelyn Jean After hours and   weekends, please call [phone] and speak with the neurosurgeon on call For a life-threatening emergency, call 911  AMBULATORY SURGERY  DISCHARGE INSTRUCTIONS   The drugs that you were given will stay in your system until tomorrow so for the next 24 hours you should not:  Drive an automobile Make any legal decisions Drink any alcoholic beverage   You may resume regular  meals tomorrow.  Today it is better to start with liquids and gradually work up to solid foods.  You may eat anything you prefer, but it is better to start with liquids, then soup and crackers, and gradually work up to solid foods.   Please notify your doctor immediately if you have any unusual bleeding, trouble breathing, redness and pain at the surgery site, drainage, fever, or pain not relieved by medication.    Additional Instructions:  leave green armband on for 4 days        Please contact your physician with any problems or Same Day Surgery at [phone], Monday through Friday 6 am to 4 pm, or [place] at [facility] number at [phone].

## 2022-08-26 NOTE — Discharge Summary (Signed)
Discharge Summary  Patient ID: Brady Johnson MRN: [id number] DOB/AGE: [birth year] 62 y.o.  Admit date: 08/26/2022 Discharge date: 08/26/2022  Admission Diagnoses:  M54.16 lumbar radiculopathy, R29.898 left leg weakness.    Discharge Diagnoses:  Active Problems:   Lumbar radiculopathy   Discharged Condition: good  Hospital Course:  Brady Johnson is a 62 year old male presenting with lumbar radiculopathy.  He underwent a left 5 S1 microdiscectomy.  His intraoperative course was uncomplicated.  He was monitored in PACU and discharged home after ambulating, urinating, and tolerating p.o. intake.  He was given prescriptions for pain medication and muscle relaxer.  Consults: None  Significant Diagnostic Studies: none  Treatments: surgery: As above.  Please see separately dictated operative report for further details  Discharge Exam: Blood pressure (!) 83/61, pulse 63, temperature (!) 97.4 F (36.3 C), resp. rate 11, height 5\' 8"  (1.727 m), weight 94.8 kg, SpO2 100 %. CN grossly intact 5/5 throughout RLE and LLE except 3/5 left PF  Disposition: Discharge disposition: 01-Home or Self Care        Allergies as of 08/26/2022   No Known Allergies      Medication List     STOP taking these medications    methylPREDNISolone 4 MG Tbpk tablet Commonly known as: MEDROL DOSEPAK       TAKE these medications    amLODipine 10 MG tablet Commonly known as: NORVASC TAKE 1 TABLET BY MOUTH EVERY DAY   aspirin EC 81 MG tablet Take 81 mg by mouth daily.   atorvastatin 20 MG tablet Commonly known as: LIPITOR TAKE 1 TABLET BY MOUTH EVERY DAY   colchicine 0.6 MG tablet Take 1 tablet by mouth daily as needed.   cyclobenzaprine 10 MG tablet Commonly known as: FLEXERIL Take 1 tablet (10 mg total) by mouth 3 (three) times daily as needed.   furosemide 20 MG tablet Commonly known as: LASIX Take 1 tablet (20 mg total) by mouth daily.   losartan 100 MG tablet Commonly  known as: COZAAR Take 100 mg by mouth daily.   meloxicam 15 MG tablet Commonly known as: MOBIC Take 15 mg by mouth daily.   metoprolol succinate 50 MG 24 hr tablet Commonly known as: TOPROL-XL TAKE 1 TABLET BY MOUTH EVERY DAY   oxyCODONE 5 MG immediate release tablet Commonly known as: Oxy IR/ROXICODONE Take 1 tablet (5 mg total) by mouth every 4 (four) hours as needed for severe pain.   senna 8.6 MG Tabs tablet Commonly known as: SENOKOT Take 1 tablet (8.6 mg total) by mouth daily as needed for mild constipation.   sildenafil 20 MG tablet Commonly known as: REVATIO Take 20 mg by mouth as needed.   VICTORION-1 PREVENT inclisiran or placebo 300 mg/1.5 mL SQ injection Inject 300 mg into the skin every 6 (six) months. For Investigational Use Only. Inject subcutaneously into the abdomen, upper arm or thigh every 6 months in clinic. Do not inject into areas of active skin disease,such as sun burns, skin rashes, inflammation, or skin infections. Please contact [facility] for any questions or concerns regarding this medication.        Follow-up Information     Drake Leach, PA-C Follow up on 09/09/2022.   Specialty: Neurosurgery Contact information: [address] [place] [address] [phone]                 Signed: Susanne Borders 08/26/2022, 10:33 AM

## 2022-08-26 NOTE — Interval H&P Note (Signed)
History and Physical Interval Note:  08/26/2022 8:45 AM  Brady Johnson  has presented today for surgery, with the diagnosis of M54.16 lumbar radiculopathy R29.898 left leg weakness.  The various methods of treatment have been discussed with the patient and family. After consideration of risks, benefits and other options for treatment, the patient has consented to  Procedure(s): LEFT L5-S1 MICRODISCECTOMY (Left) as a surgical intervention.  The patient's history has been reviewed, patient examined, no change in status, stable for surgery.  I have reviewed the patient's chart and labs.  Questions were answered to the patient's satisfaction.    Heart sounds normal no MRG. Chest Clear to Auscultation Bilaterally.    Rodina Pinales

## 2022-08-27 ENCOUNTER — Encounter: Payer: Self-pay | Admitting: Neurosurgery

## 2022-09-02 ENCOUNTER — Other Ambulatory Visit: Payer: Self-pay | Admitting: Internal Medicine

## 2022-09-03 NOTE — Anesthesia Postprocedure Evaluation (Signed)
Anesthesia Post Note  Patient: Brady Johnson  Procedure(s) Performed: LEFT L5-S1 MICRODISCECTOMY (Left: Spine Lumbar)  Patient location during evaluation: PACU Anesthesia Type: General Level of consciousness: awake and alert Pain management: pain level controlled Vital Signs Assessment: post-procedure vital signs reviewed and stable Respiratory status: spontaneous breathing, nonlabored ventilation, respiratory function stable and patient connected to nasal cannula oxygen Cardiovascular status: blood pressure returned to baseline and stable Postop Assessment: no apparent nausea or vomiting Anesthetic complications: no   No notable events documented.   Last Vitals:  Vitals:   08/26/22 1149 08/26/22 1240  BP: 116/73 112/69  Pulse: 63 70  Resp: 16 18  Temp: 36.4 C (!) 36.1 C  SpO2: 97% 100%    Last Pain:  Vitals:   08/26/22 1240  TempSrc: Temporal  PainSc: 0-No pain                 Lenard Simmer

## 2022-09-04 NOTE — Progress Notes (Signed)
   REFERRING PHYSICIAN:  [facility] [address] [place],  [place] [address]  DOS:  08/26/22  Left L5-S1 microdiscectomy  HISTORY OF PRESENT ILLNESS: LARRIE PERIMAN is approximately 2 weeks status post left L5-S1 microdiscectomy. Was given flexeril and oxycodone on discharge from the hospital.   His left leg pain is mostly gone, has some discomfort in left leg with walking. No LBP.   Not taking oxycodone. He is taking prn motrin/tylenol. Taking prn flexeril.    PHYSICAL EXAMINATION:  General: Patient is well developed, well nourished, calm, collected, and in no apparent distress.   NEUROLOGICAL:  General: In no acute distress.   Awake, alert, oriented to person, place, and time.  Pupils equal round and reactive to light.  Facial tone is symmetric.     Strength:            Side Iliopsoas Quads Hamstring PF DF EHL  R 5 5 5 5 5 5   L 5 5 5 5 5 5    Incision c/d/i   ROS (Neurologic):  Negative except as noted above  IMAGING: Nothing new to review.   ASSESSMENT/PLAN:  DACEN PRUDENCIO is doing well s/p above surgery. Treatment options reviewed with patient and following plan made:   - I have advised the patient to lift up to 10 pounds until 6 weeks after surgery (follow up with Dr. Myer Haff).  - Reviewed wound care.  - No bending, twisting, or lifting.  - Continue on current medications including motrin/tylenol as directed. Hold mobic while on motrin.  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 Will plan to go back on 10/05/22 with above restrictions.  - Follow up as scheduled in 4 weeks and prn.   Advised to contact the office if any questions or concerns arise.  Drake Leach PA-C Department of neurosurgery

## 2022-09-09 ENCOUNTER — Ambulatory Visit (INDEPENDENT_AMBULATORY_CARE_PROVIDER_SITE_OTHER): Payer: BC Managed Care – PPO | Admitting: Orthopedic Surgery

## 2022-09-09 ENCOUNTER — Encounter: Payer: Self-pay | Admitting: Orthopedic Surgery

## 2022-09-09 VITALS — BP 124/76 | Ht 68.0 in | Wt 208.0 lb

## 2022-09-09 DIAGNOSIS — M5416 Radiculopathy, lumbar region: Secondary | ICD-10-CM

## 2022-09-09 DIAGNOSIS — Z9889 Other specified postprocedural states: Secondary | ICD-10-CM

## 2022-09-09 DIAGNOSIS — Z09 Encounter for follow-up examination after completed treatment for conditions other than malignant neoplasm: Secondary | ICD-10-CM

## 2022-09-23 ENCOUNTER — Telehealth: Payer: Self-pay

## 2022-09-23 NOTE — Telephone Encounter (Signed)
-----   Message from Cristin E Ray sent at 09/22/2022  2:05 PM EDT ----- Would like to change his return to work date to 6/17 instead of the 24th. He understands he will still have restrictions but would like to return sooner  CB: [phone]

## 2022-09-23 NOTE — Telephone Encounter (Signed)
Please let him know that the work note has been typed up.  

## 2022-10-06 ENCOUNTER — Other Ambulatory Visit: Payer: Self-pay

## 2022-10-06 ENCOUNTER — Encounter: Payer: Self-pay | Admitting: Neurosurgery

## 2022-10-06 ENCOUNTER — Ambulatory Visit (INDEPENDENT_AMBULATORY_CARE_PROVIDER_SITE_OTHER): Payer: BC Managed Care – PPO | Admitting: Neurosurgery

## 2022-10-06 ENCOUNTER — Encounter: Payer: BC Managed Care – PPO | Admitting: *Deleted

## 2022-10-06 VITALS — BP 130/80 | Ht 68.0 in | Wt 208.0 lb

## 2022-10-06 DIAGNOSIS — Z006 Encounter for examination for normal comparison and control in clinical research program: Secondary | ICD-10-CM

## 2022-10-06 DIAGNOSIS — M5416 Radiculopathy, lumbar region: Secondary | ICD-10-CM

## 2022-10-06 DIAGNOSIS — Z09 Encounter for follow-up examination after completed treatment for conditions other than malignant neoplasm: Secondary | ICD-10-CM

## 2022-10-06 MED ORDER — STUDY - VICTORION-1 PREVENT - INCLISIRAN 300 MG/1.5 ML OR PLACEBO SQ INJECTION (PI-STUCKEY)
300.0000 mg | INJECTION | SUBCUTANEOUS | Status: AC
  Administered 2022-10-06: 300 mg via SUBCUTANEOUS
  Filled 2022-10-06: qty 1.5

## 2022-10-06 NOTE — Progress Notes (Signed)
   REFERRING PHYSICIAN:  No referring provider defined for this encounter.  DOS:  08/26/22  Left L5-S1 microdiscectomy  HISTORY OF PRESENT ILLNESS: Brady Johnson is status post left L5-S1 microdiscectomy.  He is doing very well.  He has some minor discomfort in his left lower leg but it is improving.  He is much improved compared to before surgery.     PHYSICAL EXAMINATION:  General: Patient is well developed, well nourished, calm, collected, and in no apparent distress.   NEUROLOGICAL:  General: In no acute distress.   Awake, alert, oriented to person, place, and time.  Pupils equal round and reactive to light.  Facial tone is symmetric.     Strength:            Side Iliopsoas Quads Hamstring PF DF EHL  R 5 5 5 5 5 5   L 5 5 5 5 5 5    Incision c/d/i   ROS (Neurologic):  Negative except as noted above  IMAGING: Nothing new to review.   ASSESSMENT/PLAN:  Brady Johnson is doing well s/p above surgery.  He will slowly start increasing his activity level.  He is now on a 25 pound lifting limit.  Will see him back in clinic in approximately 6 weeks.     Venetia Night MD  Department of neurosurgery

## 2022-10-06 NOTE — Research (Addendum)
DATE: 06-October-2022  SUBJECT ID: [id number]   Visit:V2  Prior or concomitant medications reviewed [x]  V1P Study Drug Administration  Subject ID #: [id number] Kit #: [id number]  Administration Site: left lower abd Lipid lowering medications reviewed [x]   Surgical history reviewed [x]   TIME: BP: PULSE:  0906 131/82 72  0909 128/90 73  0913 129/81 69  Mean BP: 129/84  Mean Pulse: 71  Labs collected at: 0926 Fasting Lipid Profile [x]  Fasting Lp(a) [x]  Liver function tests [x]  Exploratory biomarkers [x]   AE/SAE assessment completed [x]   Study drug administered [x]   Endpoint assessment completed [x]    Brady Johnson is here for V2 of V1P Research. He reports going to the Ed 08-16-22 and having back surgery May 15. He reports doing much better. No abd pain. Medications reviewed.  Next visit scheduled for Dec 19 at 0900.  Re consent V1P Informed Consent  Protocol No. [id number] ICF Version number: 01.01.04     Subject met inclusion and exclusion criteria.  The informed consent form, study requirements and expectations were reviewed with the subject and questions and concerns were addressed prior to the signing of the consent form.  The subject verbalized understanding of the trial requirements.  The subject agreed to participate in the Victorion 1 Prevent trial and signed the informed consent on 10-06-22 at 0920.   A copy of the signed informed consent was given to the subject and original copy was placed in the subject's medical record.      Current Outpatient Medications:    amLODipine (NORVASC) 10 MG tablet, TAKE 1 TABLET BY MOUTH EVERY DAY, Disp: 90 tablet, Rfl: 0   aspirin EC 81 MG tablet, Take 81 mg by mouth daily., Disp: , Rfl:    atorvastatin (LIPITOR) 20 MG tablet, TAKE 1 TABLET BY MOUTH EVERY DAY, Disp: 90 tablet, Rfl: 0   colchicine 0.6 MG tablet, Take 1 tablet by mouth daily as needed. , Disp: , Rfl:    cyclobenzaprine (FLEXERIL) 10 MG tablet, Take 1 tablet (10 mg  total) by mouth 3 (three) times daily as needed., Disp: 90 tablet, Rfl: 0   furosemide (LASIX) 20 MG tablet, Take 1 tablet (20 mg total) by mouth daily., Disp: 90 tablet, Rfl: 3   losartan (COZAAR) 100 MG tablet, Take 100 mg by mouth daily., Disp: , Rfl:    meloxicam (MOBIC) 15 MG tablet, Take 15 mg by mouth daily., Disp: , Rfl:    metoprolol succinate (TOPROL-XL) 50 MG 24 hr tablet, TAKE 1 TABLET BY MOUTH EVERY DAY, Disp: 90 tablet, Rfl: 1   oxyCODONE (OXY IR/ROXICODONE) 5 MG immediate release tablet, Take 1 tablet (5 mg total) by mouth every 4 (four) hours as needed for severe pain., Disp: 30 tablet, Rfl: 0   senna (SENOKOT) 8.6 MG TABS tablet, Take 1 tablet (8.6 mg total) by mouth daily as needed for mild constipation., Disp: 30 tablet, Rfl: 0   sildenafil (REVATIO) 20 MG tablet, Take 20 mg by mouth as needed., Disp: , Rfl:    Study - VICTORION-1 PREVENT - inclisiran 300 mg/1.65mL or placebo SQ injection (PI-Stuckey), Inject 300 mg into the skin every 6 (six) months. For Investigational Use Only. Inject subcutaneously into the abdomen, upper arm or thigh every 6 months in clinic. Do not inject into areas of active skin disease,such as sun burns, skin rashes, inflammation, or skin infections. Please contact [facility] for any questions or concerns regarding this medication., Disp: , Rfl:   Current Facility-Administered Medications:    Study -  VICTORION-1 PREVENT - inclisiran 300 mg/1.70mL or placebo SQ injection (PI-Stuckey), 300 mg, Subcutaneous, Q6 months, Jodelle Red, MD, 300 mg at 10/06/22 0930

## 2022-10-14 NOTE — Research (Signed)
ACCESSION NO. [id number]                                             Page 1 of 1                        [id number]                   INVESTIGATOR: ([id number])                          PROTOCOL   [id number]                     Shawnie Pons M.D.                            INVESTIGATOR NO.: [id number]                     c/o Blair Promise                           PATIENT NUMBER: [id number]                     [facility]                  SUBJECT INITIALS NOT COLLECTED:                     [address]                          VISIT: [place],  [place] [address] V2/V4                   SPONSOR REPORT TO:                 COLLECTION TIME:09:30 DATE:06-Oct-2022                     Catrin Deck                      DATE RECEIVED IN LABORATORY: 07-Oct-2022                     c/o Theodosia Blender              DATE REPORTED BY LABORATORY: 13-Oct-2022                     [employer]                         SEX: M  BIRTHDATE:  13-Apr-1960    AGE: 62y                     [facility] [address], [place] [address]  Ref. Ranges               Clinical    Comments                                                                          Significance                                                                            Yes*  No                 IS SUBJECT FASTING?                      Fasting?       Yes                                       LDL-C ALERT                      LDLC Alert     Criteria not met                   HY'S LAW                      ALT & TBIL     Criteria not met                                        ALT-BILIRUBIN ALERT                      ALT & TBIL     Criteria not met                                        AST-BILIRUBIN ALERT                      AST & TBIL     Criteria not met

## 2022-10-16 NOTE — Research (Signed)
Are there any labs that are clinically significant?  Yes []  OR No[]     LIVER FUNCTION TEST PSPM                      TotBilCopy     1.0          mg/dL No Ref Rng                             AlhPhsCopy     76           U/L No Ref Rng                              ALT Copy       21           U/L No Ref Rng                              AST Copy       21           U/L No Ref Rng

## 2022-10-26 ENCOUNTER — Other Ambulatory Visit: Payer: Self-pay | Admitting: Neurosurgery

## 2022-10-26 NOTE — Telephone Encounter (Signed)
I have left a message for the patient to call us back.  Does he need a refill of this? This was last refilled on 08/26/22.

## 2022-10-28 ENCOUNTER — Other Ambulatory Visit: Payer: Self-pay | Admitting: Internal Medicine

## 2022-10-29 MED ORDER — ATORVASTATIN CALCIUM 20 MG PO TABS: 20.0000 mg | ORAL_TABLET | Freq: Every day | ORAL | 0 refills | Status: DC

## 2022-10-29 NOTE — Telephone Encounter (Signed)
Please schedule F/U appointment for further refills. Thank you! 

## 2022-11-02 NOTE — Telephone Encounter (Signed)
Left voicemail to schedule follow up appt

## 2022-11-05 ENCOUNTER — Encounter: Payer: Self-pay | Admitting: Internal Medicine

## 2022-11-17 ENCOUNTER — Encounter: Payer: BC Managed Care – PPO | Admitting: Orthopedic Surgery

## 2022-11-17 ENCOUNTER — Encounter: Payer: BC Managed Care – PPO | Admitting: Neurosurgery

## 2022-11-24 ENCOUNTER — Ambulatory Visit (INDEPENDENT_AMBULATORY_CARE_PROVIDER_SITE_OTHER): Payer: BC Managed Care – PPO | Admitting: Neurosurgery

## 2022-11-24 VITALS — BP 120/70 | Temp 98.8°F | Ht 68.0 in | Wt 211.4 lb

## 2022-11-24 DIAGNOSIS — Z09 Encounter for follow-up examination after completed treatment for conditions other than malignant neoplasm: Secondary | ICD-10-CM

## 2022-11-24 DIAGNOSIS — Z9889 Other specified postprocedural states: Secondary | ICD-10-CM

## 2022-11-24 DIAGNOSIS — M5416 Radiculopathy, lumbar region: Secondary | ICD-10-CM

## 2022-11-24 MED ORDER — MELOXICAM 15 MG PO TABS: 15.0000 mg | ORAL_TABLET | Freq: Every day | ORAL | 3 refills | Status: DC

## 2022-11-24 NOTE — Progress Notes (Signed)
   REFERRING PHYSICIAN:  No referring provider defined for this encounter.  DOS:  08/26/22  Left L5-S1 microdiscectomy  HISTORY OF PRESENT ILLNESS:  11/24/22 Raimon Deppen is a pleasant 62 y.o presenting after lumbar microdiscectomy.  He states that he has had increased left buttock, posterior thigh, and pain into his calf over the last 2 weeks without any particular inciting event.  He has been taking Tylenol and doing home exercises which has helped some.  He denies any back discomfort.  10/06/22 Mardene Celeste is status post left L5-S1 microdiscectomy.  He is doing very well.  He has some minor discomfort in his left lower leg but it is improving.  He is much improved compared to before surgery.     PHYSICAL EXAMINATION:  General: Patient is well developed, well nourished, calm, collected, and in no apparent distress.   NEUROLOGICAL:  General: In no acute distress.   Awake, alert, oriented to person, place, and time.  Pupils equal round and reactive to light.  Facial tone is symmetric.     Strength:            Side Iliopsoas Quads Hamstring PF DF EHL  R 5 5 5 5 5 5   L 5 5 5 5 5 5    Incision well healed   ROS (Neurologic):  Negative except as noted above  IMAGING: Nothing new to review.   ASSESSMENT/PLAN:  DOUGLASS KROM is doing well s/p above surgery.  He has had some recurrent radiating left leg pain over the last 2 weeks.  We discussed the possibility of a recurrent disc herniation versus some nerve irritation due to increased activity.  He would like to try restarting his meloxicam for a couple of weeks.  We also discussed the possibility of trying physical therapy versus updating a lumbar MRI but he would like to hold off on these things at this time.  We will see him back in 12 weeks to evaluate his progress or sooner should he have any questions or concerns.  I have provided him with a refill of his meloxicam.  I spent a total of 20 minutes in both face-to-face  and non-face-to-face activities for this visit on the date of this encounter including review of records, review of symptoms, physical exam, discussion of differential diagnosis, documentation, and order placement.   Manning Charity PA-C Department of neurosurgery

## 2022-11-29 ENCOUNTER — Other Ambulatory Visit: Payer: Self-pay | Admitting: Internal Medicine

## 2022-12-18 ENCOUNTER — Other Ambulatory Visit: Payer: Self-pay | Admitting: Internal Medicine

## 2022-12-18 DIAGNOSIS — I422 Other hypertrophic cardiomyopathy: Secondary | ICD-10-CM

## 2022-12-18 NOTE — Telephone Encounter (Signed)
Please contact pt for future appointment. Pt overdue for f/u. 

## 2022-12-24 ENCOUNTER — Other Ambulatory Visit: Payer: Self-pay | Admitting: Neurosurgery

## 2022-12-24 NOTE — Telephone Encounter (Signed)
Unable to leave voicemail appt needs to be scheduled

## 2023-01-01 ENCOUNTER — Other Ambulatory Visit: Payer: Self-pay | Admitting: Neurosurgery

## 2023-01-29 ENCOUNTER — Other Ambulatory Visit: Payer: Self-pay | Admitting: Internal Medicine

## 2023-01-29 MED ORDER — ATORVASTATIN CALCIUM 20 MG PO TABS: 20.0000 mg | ORAL_TABLET | Freq: Every day | ORAL | 0 refills | Status: DC

## 2023-02-04 ENCOUNTER — Ambulatory Visit
Admission: RE | Admit: 2023-02-04 | Discharge: 2023-02-04 | Disposition: A | Discharge status: Home / Self Care | Payer: BC Managed Care – PPO | Source: Ambulatory Visit | Attending: Physician Assistant | Admitting: Physician Assistant

## 2023-02-04 DIAGNOSIS — I7121 Aneurysm of the ascending aorta, without rupture: Secondary | ICD-10-CM | POA: Diagnosis present

## 2023-02-04 MED ORDER — IOHEXOL 350 MG/ML SOLN
75.0000 mL | Freq: Once | INTRAVENOUS | Status: AC | PRN
  Administered 2023-02-04: 75 mL via INTRAVENOUS

## 2023-02-15 ENCOUNTER — Other Ambulatory Visit: Payer: Self-pay | Admitting: Emergency Medicine

## 2023-02-15 DIAGNOSIS — Z79899 Other long term (current) drug therapy: Secondary | ICD-10-CM

## 2023-02-15 DIAGNOSIS — E782 Mixed hyperlipidemia: Secondary | ICD-10-CM

## 2023-02-15 MED ORDER — ATORVASTATIN CALCIUM 40 MG PO TABS: 40.0000 mg | ORAL_TABLET | Freq: Every day | ORAL | 0 refills | Status: DC

## 2023-02-16 ENCOUNTER — Telehealth: Payer: Self-pay | Admitting: Internal Medicine

## 2023-02-16 ENCOUNTER — Ambulatory Visit: Payer: Medicare Other | Admitting: Neurosurgery

## 2023-02-16 ENCOUNTER — Encounter: Payer: Self-pay | Admitting: Neurosurgery

## 2023-02-16 VITALS — BP 134/82 | Ht 68.0 in | Wt 211.0 lb

## 2023-02-16 DIAGNOSIS — Z09 Encounter for follow-up examination after completed treatment for conditions other than malignant neoplasm: Secondary | ICD-10-CM

## 2023-02-16 DIAGNOSIS — M5416 Radiculopathy, lumbar region: Secondary | ICD-10-CM

## 2023-02-16 NOTE — Progress Notes (Signed)
   REFERRING PHYSICIAN:  [facility] [address] [place],  [place] [address]  DOS:  08/26/22  Left L5-S1 microdiscectomy  HISTORY OF PRESENT ILLNESS:  02/16/2023 Much better than prior to surgery. Having some issues with back soreness with standing.    11/24/22 Brady Johnson is a pleasant 62 y.o presenting after lumbar microdiscectomy.  He states that he has had increased left buttock, posterior thigh, and pain into his calf over the last 2 weeks without any particular inciting event.  He has been taking Tylenol and doing home exercises which has helped some.  He denies any back discomfort.  10/06/22 Brady Johnson is status post left L5-S1 microdiscectomy.  He is doing very well.  He has some minor discomfort in his left lower leg but it is improving.  He is much improved compared to before surgery.     PHYSICAL EXAMINATION:  General: Patient is well developed, well nourished, calm, collected, and in no apparent distress.   NEUROLOGICAL:  General: In no acute distress.   Awake, alert, oriented to person, place, and time.  Pupils equal round and reactive to light.  Facial tone is symmetric.     Strength:            Side Iliopsoas Quads Hamstring PF DF EHL  R 5 5 5 5 5 5   L 5 5 5 5 5 5    Incision well healed   ROS (Neurologic):  Negative except as noted above  IMAGING: Nothing new to review.   ASSESSMENT/PLAN:  Brady Johnson is doing well s/p above surgery. He has some pain with everyday actions.   - Follow up PRN  Venetia Night MD Department of neurosurgery

## 2023-02-16 NOTE — Telephone Encounter (Signed)
Left a message for the patient to call back.    Sondra Barges, PA-C 02/14/2023  7:04 AM EST     Please inform the patient his CT scan showed stable 4.1 cm aortic root dilatation with stable 3.9 cm ascending aortic enlargement.  Other incidental findings include coronary artery calcification (which was already known) small hiatal hernia, mild fatty liver, nonobstructing right-sided kidney stone, and lesion on the kidney of uncertain clinical significance.   Recommendations: -Follow-up CTA or MRA aorta in 12 months -Recommend he follow-up with PCP for follow-up kidney ultrasound to assess cystic kidney lesion, please forward to PCP as FYI -LDL is above goal on labs obtained through PCP's office in 02/2022 (105) with target LDL less than 70 with recommendation to increase atorvastatin to 40 mg daily with a follow-up fasting lipid panel and LFT in 2 months -Patient is overdue for follow-up, please have him schedule appointment with Dr. Okey Dupre or myself at his convenience

## 2023-02-16 NOTE — Telephone Encounter (Signed)
Pt called back and message left for time frame with the lab work question.  Pt directed to call back or to reference MYChart message sent 11/4.

## 2023-02-16 NOTE — Telephone Encounter (Signed)
Patient returned staff call and wants to know when he should do his lab work.  Patient has appointment scheduled on 05/19/23.

## 2023-02-28 ENCOUNTER — Other Ambulatory Visit: Payer: Self-pay | Admitting: Internal Medicine

## 2023-04-01 ENCOUNTER — Other Ambulatory Visit: Payer: Self-pay

## 2023-04-01 ENCOUNTER — Encounter: Payer: BC Managed Care – PPO | Admitting: *Deleted

## 2023-04-01 DIAGNOSIS — Z006 Encounter for examination for normal comparison and control in clinical research program: Secondary | ICD-10-CM

## 2023-04-01 MED ORDER — STUDY - VICTORION-1 PREVENT - INCLISIRAN 300 MG/1.5 ML OR PLACEBO SQ INJECTION (PI-STUCKEY)
300.0000 mg | INJECTION | SUBCUTANEOUS | Status: DC
  Administered 2023-04-01: 300 mg via SUBCUTANEOUS
  Filled 2023-04-01: qty 1.5

## 2023-04-01 NOTE — Research (Cosign Needed)
DATE: 01-Apr-2023  SUBJECT ID: [id number]   Visit: V3        Day: 270         Prior or concomitant medications reviewed [x]   Lipid lowering medications reviewed [x]   Surgical history reviewed [x]   TIME: BP: PULSE:  0905 124/68 56  0907 120/78 57  0909 133/78 57  Mean BP: 125/74  Mean Pulse:56  Labs collected at: 0914 Fasting Lipid Profile [x]  Fasting Lp(a) []  Liver function tests []  Exploratory biomarkers []   AE/SAE assessment completed [x]   Study drug administered [x]   Endpoint assessment completed [x]   Mr Hartsel is here for V3 of Victorion-1 prevent. He reports no visits to the ed or urgent care, no abd pain, and does report an increase in Atorvastatin to 40 mg daily. Blood drawn at 0914. Injection given in right lower abd at 0949 tol well Kit number [id number]. Scheduled next visit for June 17 at 0900.   Current Outpatient Medications:    acetaminophen (TYLENOL 8 HOUR ARTHRITIS PAIN) 650 MG CR tablet, Take 1,300 mg by mouth every 8 (eight) hours as needed for pain., Disp: , Rfl:    amLODipine (NORVASC) 10 MG tablet, TAKE 1 TABLET BY MOUTH EVERY DAY, Disp: 90 tablet, Rfl: 0   aspirin EC 81 MG tablet, Take 81 mg by mouth daily., Disp: , Rfl:    atorvastatin (LIPITOR) 40 MG tablet, Take 1 tablet (40 mg total) by mouth daily. PLEASE SCHEDULE OFFICE VISIT FOR FURTHER REFILLS. THANK YOU!, Disp: 90 tablet, Rfl: 0   colchicine 0.6 MG tablet, Take 1 tablet by mouth daily as needed. , Disp: , Rfl:    furosemide (LASIX) 20 MG tablet, TAKE 1 TABLET BY MOUTH EVERY DAY, Disp: 90 tablet, Rfl: 0   losartan (COZAAR) 100 MG tablet, Take 100 mg by mouth daily., Disp: , Rfl:    meloxicam (MOBIC) 15 MG tablet, Take 1 tablet (15 mg total) by mouth daily., Disp: 30 tablet, Rfl: 3   metoprolol succinate (TOPROL-XL) 50 MG 24 hr tablet, TAKE 1 TABLET BY MOUTH EVERY DAY, Disp: 90 tablet, Rfl: 1   sildenafil (REVATIO) 20 MG tablet, Take 20 mg by mouth as needed., Disp: , Rfl:    Study - VICTORION-1  PREVENT - inclisiran 300 mg/1.12mL or placebo SQ injection (PI-Stuckey), Inject 300 mg into the skin every 6 (six) months. For Investigational Use Only. Inject subcutaneously into the abdomen, upper arm or thigh every 6 months in clinic. Do not inject into areas of active skin disease,such as sun burns, skin rashes, inflammation, or skin infections. Please contact [facility] for any questions or concerns regarding this medication., Disp: , Rfl:   Current Facility-Administered Medications:    Study - VICTORION-1 PREVENT - inclisiran 300 mg/1.64mL or placebo SQ injection (PI-Stuckey), 300 mg, Subcutaneous, Q6 months, Jodelle Red, MD, 300 mg at 10/06/22 0930   Study - VICTORION-1 PREVENT - inclisiran 300 mg/1.29mL or placebo SQ injection (PI-Stuckey), 300 mg, Subcutaneous, Q6 months, , 300 mg at 04/01/23 [phone]

## 2023-04-17 ENCOUNTER — Other Ambulatory Visit: Payer: Self-pay | Admitting: Neurosurgery

## 2023-04-19 NOTE — Telephone Encounter (Signed)
 Patient returned my call regarding this refill. He states that he is having worsening right side sciatic pain. He was seen by urgent care for this issue and was given prednisone  and Flexeril . I told him that per [facility], she could provide one refill but he would need to follow up with his PCP. He was agreeable to this and states he is following up with his PCP later this month. If anything changes I am happy to call him back.

## 2023-04-19 NOTE — Telephone Encounter (Signed)
 I attempted to contact patient as requested by Duwayne Heck PA as to more information about him requesting a refill for [facility]. Left a message at 0920 for patient to call our office back.

## 2023-04-19 NOTE — Telephone Encounter (Signed)
 I called patient back to update him on Danielle's advisory to not take the prednisone  and meloxicam  together. He indicated understanding and stated that he has one dose of his prednisone  left, so that he will start his meloxicam  tomorrow. I was also able to reaffirm that he can come see us  if he continues to experience issues with his right side sciatica pain that he was recently seen in urgent care for.

## 2023-05-06 IMAGING — CT CT ANGIO CHEST
2 of 6 series · 13 of 36 positions shown · IV contrast (omnipaque)
Comparison: Cardiac MRI 06/20/2019

CLINICAL DATA: Aortic dilatation by echo

EXAM:
CT ANGIOGRAPHY CHEST WITH CONTRAST
TECHNIQUE: Multidetector CT imaging of the chest was performed using the
standard protocol during bolus administration of intravenous
contrast. Multiplanar CT image reconstructions and MIPs were
obtained to evaluate the vascular anatomy.
CONTRAST:  75mL OMNIPAQUE IOHEXOL 350 MG/ML SOLN

[Series 5: axial arterial cta thorax 2.00 · axial · arterial · 0.73mm/px · z∈[-1246,-962]mm · 12 of 168 slices shown]
[im 13/168  lung]
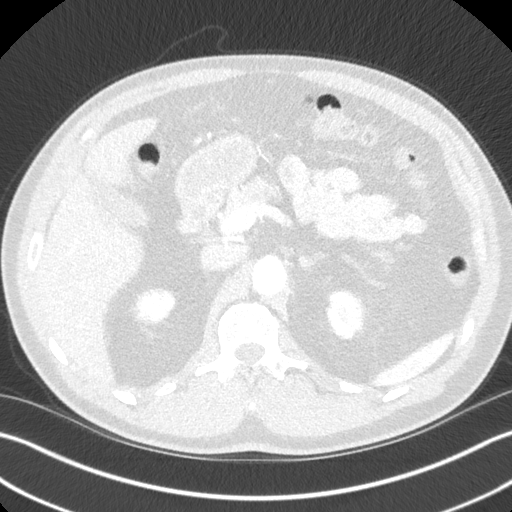
[im 26/168  mediastinal]
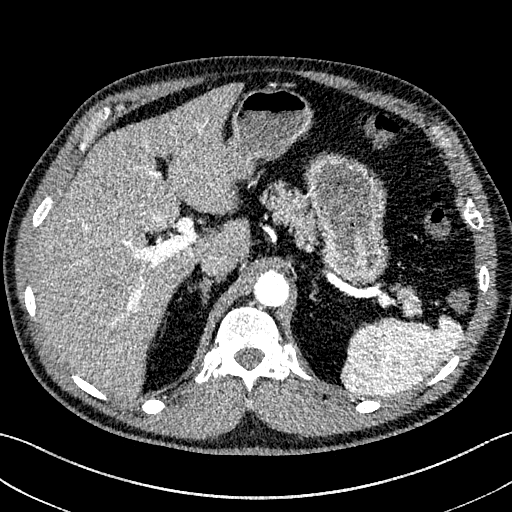
[im 39/168  lung]
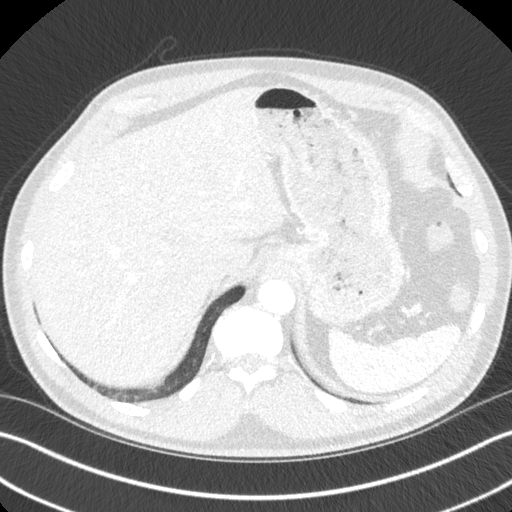
[im 52/168  mediastinal]
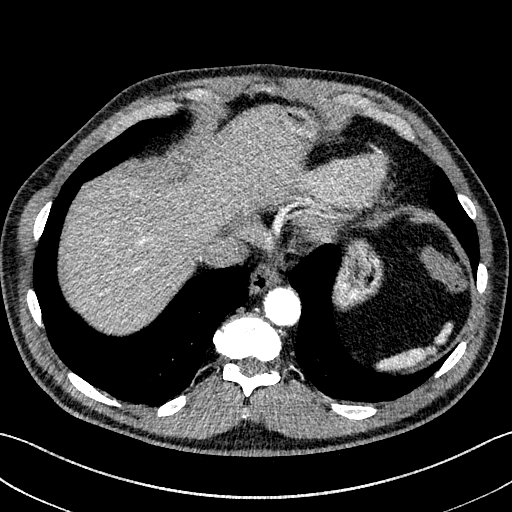
[im 65/168  lung]
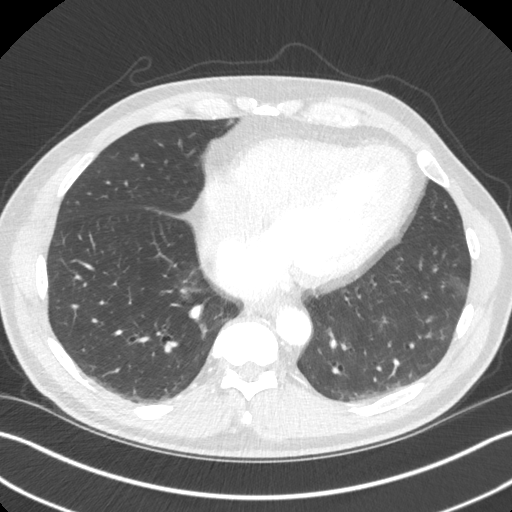
[im 78/168  mediastinal]
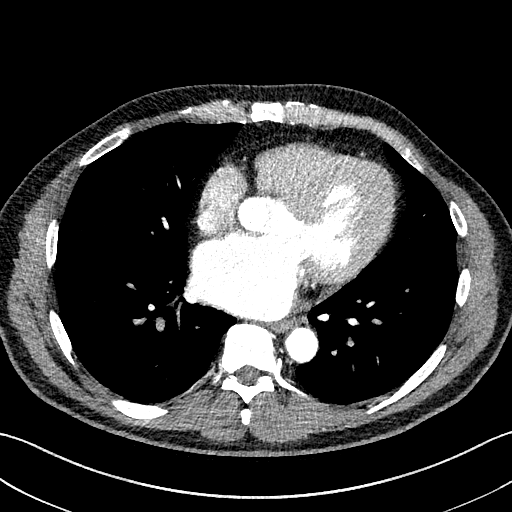
[im 90/168  lung]
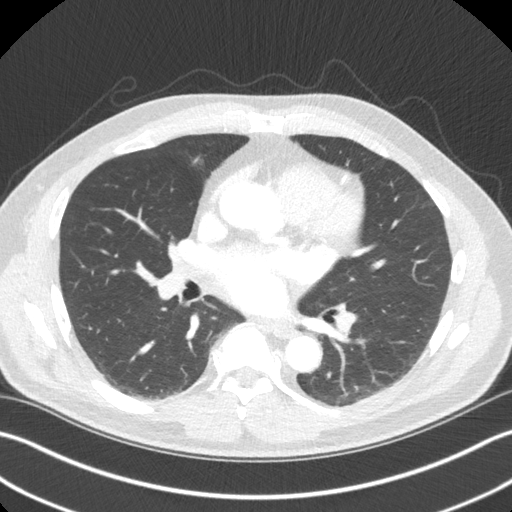
[im 103/168  mediastinal]
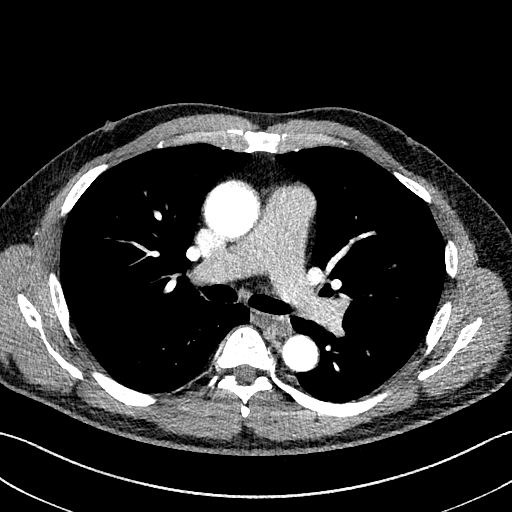
[im 116/168  lung]
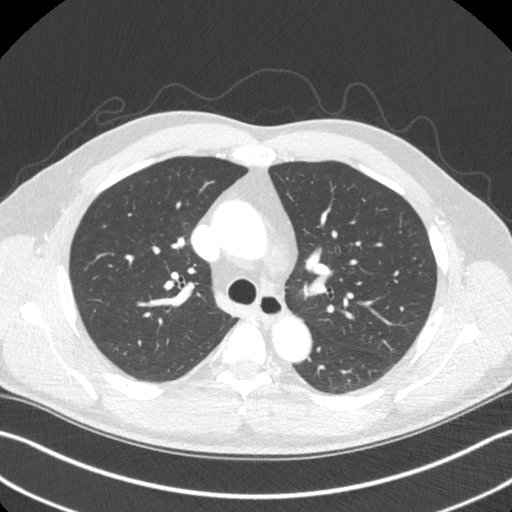
[im 129/168  mediastinal]
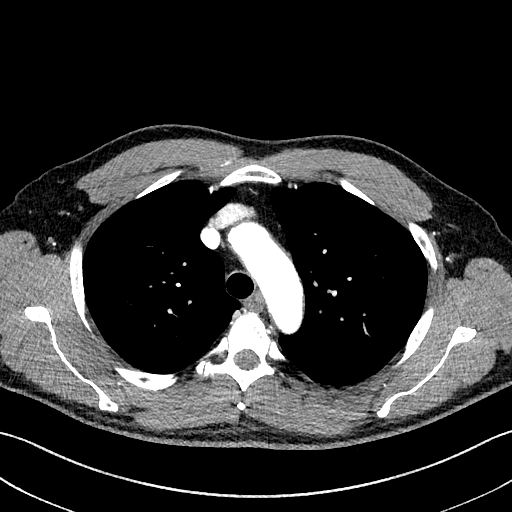
[im 142/168  lung]
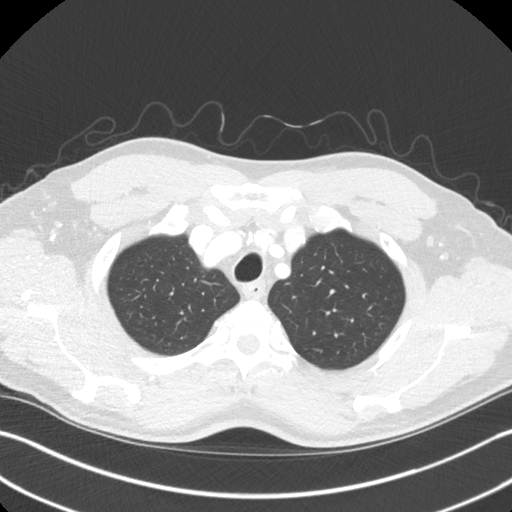
[im 155/168  mediastinal]
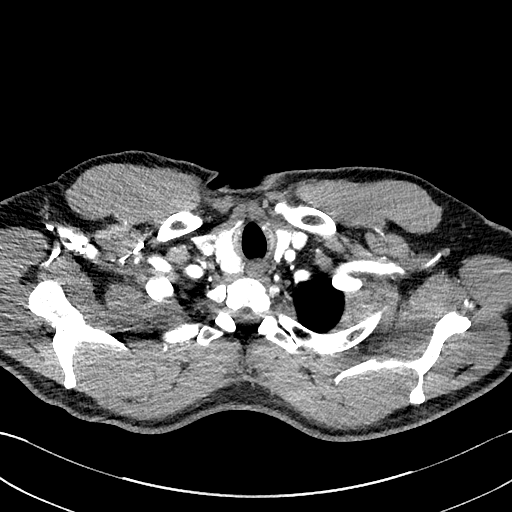

[Series 8: cor st cta thorax 2.00 cor · coronal · 0.66mm/px · 1 of 152 slices shown]
[im 76/152  mediastinal]
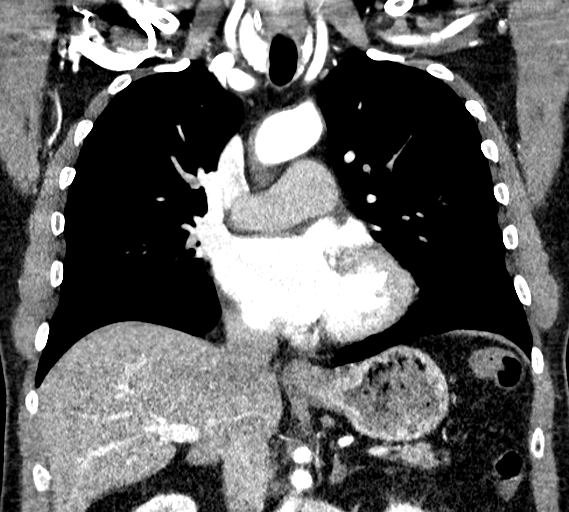

[13 of 36 positions shown; findings below may reference images not displayed]

FINDINGS: Cardiovascular: Very minor fusiform aneurysmal dilatation of the
ascending thoracic aorta, maximal diameter 40 mm. Minor
atherosclerotic change. No acute dissection or intramural hematoma.
Patent 2 vessel arch anatomy, normal variant. Overall limited
assessment of pulmonary arteries. No large central pulmonary
embolus.

Normal heart size. Native coronary atherosclerosis. No pericardial
effusion. Central venous structures are patent. No Pasinato
process.

Mediastinum/Nodes: No enlarged mediastinal, hilar, or axillary lymph
nodes. Thyroid gland, trachea, and esophagus demonstrate no
significant findings.

Lungs/Pleura: Lungs are clear. No pleural effusion or pneumothorax.

Upper Abdomen: No acute abnormality.

Musculoskeletal: No chest wall abnormality. No acute or significant
osseous findings.

Review of the MIP images confirms the above findings.
IMPRESSION: Minor fusiform aneurysmal dilatation of the ascending thoracic
aorta, maximal diameter 40 mm.

Recommend annual imaging followup by CTA or MRA. This recommendation
follows 6858 ACCF/AHA/AATS/ACR/ASA/SCA/MOATSHE/TOYTO/KAKI/SINOVIA Guidelines
for the Diagnosis and Management of Patients with Thoracic Aortic
Disease. Circulation. 6858; 121: E266-e369. Aortic aneurysm NOS
(3T1WL-3YL.9)

No other acute intrathoracic finding.

Native coronary atherosclerosis

Aortic Atherosclerosis (3T1WL-3HX.X).

## 2023-05-11 ENCOUNTER — Other Ambulatory Visit: Payer: Self-pay | Admitting: Physician Assistant

## 2023-05-19 ENCOUNTER — Telehealth: Payer: Self-pay

## 2023-05-19 ENCOUNTER — Encounter: Payer: Self-pay | Admitting: Internal Medicine

## 2023-05-19 ENCOUNTER — Ambulatory Visit: Payer: Medicare Other | Attending: Internal Medicine | Admitting: Internal Medicine

## 2023-05-19 VITALS — BP 130/72 | HR 69 | Ht 67.0 in | Wt 212.0 lb

## 2023-05-19 DIAGNOSIS — I10 Essential (primary) hypertension: Secondary | ICD-10-CM

## 2023-05-19 DIAGNOSIS — N281 Cyst of kidney, acquired: Secondary | ICD-10-CM | POA: Diagnosis present

## 2023-05-19 DIAGNOSIS — I2510 Atherosclerotic heart disease of native coronary artery without angina pectoris: Secondary | ICD-10-CM

## 2023-05-19 DIAGNOSIS — E782 Mixed hyperlipidemia: Secondary | ICD-10-CM | POA: Diagnosis present

## 2023-05-19 DIAGNOSIS — I77810 Thoracic aortic ectasia: Secondary | ICD-10-CM | POA: Diagnosis not present

## 2023-05-19 DIAGNOSIS — I422 Other hypertrophic cardiomyopathy: Secondary | ICD-10-CM

## 2023-05-19 NOTE — Telephone Encounter (Signed)
 Thank you for the update.  I will review outside labs when they arrive to see if any additional testing or medication changes are needed.  Larinda Plover

## 2023-05-19 NOTE — Patient Instructions (Signed)
 Medication Instructions:  Your physician recommends that you continue on your current medications as directed. Please refer to the Current Medication list given to you today.   *If you need a refill on your cardiac medications before your next appointment, please call your pharmacy*   Lab Work: No labs ordered today    Testing/Procedures: Your physician has requested that you have a renal ultrasound  Take your medications as you usually do.  [facility]: [facility]   No food after 11PM the night before.  Water is OK. (Don't drink liquids if you have been instructed not to for ANOTHER test). Avoid foods that produce bowel gas, for 24 hours prior to exam (see below). No breakfast, no chewing gum, no smoking or carbonated beverages. Patient may take morning medications with water. Come in for test at least 15 minutes early to register  Follow-Up: At [facility], you and your health needs are our priority.  As part of our continuing mission to provide you with exceptional heart care, we have created designated Provider Care Teams.  These Care Teams include your primary Cardiologist (physician) and Advanced Practice Providers (APPs -  Physician Assistants and Nurse Practitioners) who all work together to provide you with the care you need, when you need it.  We recommend signing up for the patient portal called MyChart.  Sign up information is provided on this After Visit Summary.  MyChart is used to connect with patients for Virtual Visits (Telemedicine).  Patients are able to view lab/test results, encounter notes, upcoming appointments, etc.  Non-urgent messages can be sent to your provider as well.   To learn more about what you can do with MyChart, go to [email or web address].    Your next appointment:   6 month(s)  Provider:   Bernardino Bring, PA-C

## 2023-05-19 NOTE — Telephone Encounter (Signed)
 Called the patient to inform him that Dr. Mady recommended obtaining a lipid panel and LFT. The patient reported that he recently had labs drawn at his PCP's office. Nurse contacted [facility], who confirmed patient had labs completed recently and will fax them to our office for review.

## 2023-05-19 NOTE — Progress Notes (Signed)
 Cardiology Office Note:  .   Date:  05/19/2023  ID:  Evalene LITTIE Bull, DOB 1961-02-20, MRN [id number] PCP: Johnson Morna FALCON, NP  [facility] Providers Cardiologist:  Lonni Hanson, MD Electrophysiologist:  Elspeth Sage, MD     History of Present Illness: .   Discussed the use of AI scribe software for clinical note transcription with the patient, who gave verbal consent to proceed.  Brady Johnson is a 63 y.o. male with history of hypertrophic cardiomyopathy (previously seen by genetics and EP), hypertension, hyperlipidemia, obstructive sleep apnea, and gout, who presents for follow-up of hypertrophic cardiomyopathy.  I last saw him a year ago, at which time he was feeling fairly well with weight loss following addition of furosemide .  He noted mild exertional dyspnea with strenuous activities but did not report any chest pain, palpitations, lightheadedness, or syncope.  We did not make any medication changes or pursue additional testing.  Today, Mr. Canady reports that he feels similar to our prior visits. He experiences occasional shortness of breath, particularly during activities like mowing the yard, and manages this by pacing himself. He feels like his activities are not limited/restricted by dyspnea. He occasionally notices a brief fluttering sensation in his hear. These episodes are very short and not accompanied by dizziness or syncope. He denies chest pain, lightheadedness, and edema. His weight has remained stable at around 212 pounds. He is attempting to change his eating habits with a goal of reducing his weight to 190 pounds. He monitors his blood pressure at home, typically recording readings between 130-140/70-75 mmHg, and expresses concern that his home blood pressure monitor may need calibration.     ROS: See HPI  Studies Reviewed: SABRA   EKG Interpretation Date/Time:  Wednesday May 19 2023 11:26:38 EST Ventricular Rate:  69 PR Interval:  138 QRS  Duration:  98 QT Interval:  452 QTC Calculation: 484 R Axis:   -3  Text Interpretation: Normal sinus rhythm with sinus arrhythmia Possible Left atrial enlargement Left ventricular hypertrophy with repolarization abnormality ( R in aVL , Sokolow-Lyon , Romhilt-Estes ) Prolonged QT Abnormal ECG When compared with ECG of 09-Aug-2022 09:21, HEART RATE has increased Otherwise no significant change Confirmed by Averiana Clouatre, Lonni [phone]) on 05/19/2023 11:29:21 AM    Cath/PCI: LHC (03/29/2019): No significant coronary artery disease.  LVEF 60%.   EP Procedures and Devices: Event monitor (07/18/2019): Predominantly sinus rhythm with occasional PACs and rare PVCs.  Single episode of NSVT lasting 7 beats was observed, as well as atrial runs lasting up to 12 beats.   Non-Invasive Evaluation(s): CTA chest (02/04/2023): Stable mild dilation of the aortic root and ascending aorta (4.1 cm at the root, 3.9 cm above the sinotubular junction).  Prominent pulmonary trunk suggestive of pulmonary arterial hypertension.  Mild cardiomegaly with small chronic pericardial effusion.  Small hiatal hernia indeterminate hypodense lesion in the lower pole of the right kidney. TTE (02/04/2022): Asymmetric septal hypertrophy measuring up to 1.8 cm, consistent with history of HCM.  No LVOT obstruction noted.  LVEF 65-70% with normal wall motion.  Grade 2 diastolic dysfunction present.  Normal RV size and function.  Severe left atrial enlargement.  Trivial aortic regurgitation.  Mild dilation of the aortic root (4.1 cm) and ascending aorta (4.0 cm).  Normal CVP. Coronary CTA (01/19/2022): Coronary calcium  score 308 (86th percentile).  Normal coronary origins with left dominant system.  Mild (25-49%) calcified plaque in the proximal LAD and LCx.  Mild dilation of the ascending aorta, measuring  up to 3.9 cm. CTA chest (03/28/2021): Minor fusiform aneurysmal dilation of the ascending aorta, measuring up to 4.0 cm.  Aortic atherosclerosis and  coronary artery calcification noted. TTE (09/05/2019): Normal LV size with severe LVH.  LVEF greater than 75% with grade 2 diastolic dysfunction.  Left atrial pressure elevated.  Normal RV size and function.  Mild left atrial enlargement.  No significant valvular abnormality.  Mild dilation of ascending aorta noted, measuring 4.1 cm. Cardiac MRI (06/20/2019): Asymmetric LV hypertrophy measuring 2.0 cm in basal anteroseptum and 1.0 cm and posterior wall consistent with hypertrophic cardiomyopathy.  There is also significant apical hypertrophy measuring up to 1.8 cm.  Resting LVOT gradient of 16 mmHg.  Patchy late gadolinium enhancement noted.  LVEF 68%. TTE (05/11/19): Normal LV size with moderate to severe, asymmetric left ventricular hypertrophy.  LVEF 60-65% with grade 1 diastolic dysfunction.  Mild resting LVOT gradient noted (~15 mmHg).  Normal RV size and function.  Moderate left atrial enlargement.  Mild aortic root dilation (4.0 cm).  Risk Assessment/Calculations:             Physical Exam:   VS:  BP 130/72 (BP Location: Left Arm, Cuff Size: Normal)   Pulse 69   Ht 5' 7 (1.702 m)   Wt 212 lb (96.2 kg)   SpO2 98%   BMI 33.20 kg/m    Wt Readings from Last 3 Encounters:  05/19/23 212 lb (96.2 kg)  04/01/23 211 lb (95.7 kg)  02/16/23 211 lb (95.7 kg)    General:  NAD. Neck: No JVD or HJR. Lungs: Clear to auscultation bilaterally without wheezes or crackles. Heart: Regular rate and rhythm with 2/6 systolic murmur. Abdomen: Soft, nontender, nondistended. Extremities: No lower extremity edema.  ASSESSMENT AND PLAN: .    Hypertrophic cardiomyopathy: Mr. Livingood appears euvolemic and reports mild exertional dyspnea with strenuous activities consistent with NYHA class II heart failure.  Will continue his current doses of metoprolol  and furosemide .  Continue follow-up with our research team as well.  Dilated thoracic aorta: Mild dilation of the ascending aorta noted on CTA chest in  01/2023.  Continue aspirin  and statin therapy as well as blood pressure control to prevent progression.  Anticipate follow-up imaging in about a year.  Coronary artery disease: No angina reported.  Mild, nonobstructive CAD noted on coronary CTA in 01/2022.  Continue aspirin  and atorvastatin  to prevent progression of disease.  Hypertension: Blood pressure upper normal to mildly elevated.  We have agreed to defer medication changes at this time, continuing his current regimen of amlodipine  10 mg daily, losartan  100 mg daily, and metoprolol  succinate to 50 mg daily.  I encouraged Mr. Archambeau to keep exercising and to minimize his sodium intake.  Hyperlipidemia: Continue atorvastatin  40 mg daily.  Plan to have Mr. Weinreb return at his convenience for fasting lipid panel and CMP.  Renal cyst: Incompletely characterized renal hypodensity noted on CTA of the chest in October.  We will obtain a renal ultrasound at Mr. Oestreicher convenience for further evaluation.    Dispo: Return to clinic in 6 months with Bernardino Bring, PA.  Signed, Lonni Hanson, MD

## 2023-05-27 ENCOUNTER — Ambulatory Visit
Admission: RE | Admit: 2023-05-27 | Discharge: 2023-05-27 | Disposition: A | Discharge status: Home / Self Care | Payer: Medicare Other | Source: Ambulatory Visit | Attending: Internal Medicine | Admitting: Internal Medicine

## 2023-05-27 DIAGNOSIS — N281 Cyst of kidney, acquired: Secondary | ICD-10-CM | POA: Insufficient documentation

## 2023-05-30 ENCOUNTER — Other Ambulatory Visit: Payer: Self-pay | Admitting: Internal Medicine

## 2023-06-16 ENCOUNTER — Other Ambulatory Visit: Payer: Self-pay | Admitting: Internal Medicine

## 2023-06-16 DIAGNOSIS — I422 Other hypertrophic cardiomyopathy: Secondary | ICD-10-CM

## 2023-07-19 ENCOUNTER — Other Ambulatory Visit: Payer: Self-pay | Admitting: Family Medicine

## 2023-07-19 ENCOUNTER — Encounter: Payer: Self-pay | Admitting: Family Medicine

## 2023-07-19 DIAGNOSIS — M5416 Radiculopathy, lumbar region: Secondary | ICD-10-CM

## 2023-07-20 ENCOUNTER — Ambulatory Visit
Admission: RE | Admit: 2023-07-20 | Discharge: 2023-07-20 | Disposition: A | Discharge status: Home / Self Care | Source: Ambulatory Visit | Attending: Family Medicine | Admitting: Family Medicine

## 2023-07-20 DIAGNOSIS — M5416 Radiculopathy, lumbar region: Secondary | ICD-10-CM

## 2023-08-08 ENCOUNTER — Other Ambulatory Visit: Payer: Self-pay | Admitting: Physician Assistant

## 2023-08-15 ENCOUNTER — Other Ambulatory Visit: Payer: Self-pay | Admitting: Neurosurgery

## 2023-09-28 ENCOUNTER — Other Ambulatory Visit: Payer: Self-pay

## 2023-09-28 ENCOUNTER — Encounter: Payer: BC Managed Care – PPO | Admitting: *Deleted

## 2023-09-28 DIAGNOSIS — Z006 Encounter for examination for normal comparison and control in clinical research program: Secondary | ICD-10-CM

## 2023-09-28 MED ORDER — STUDY - VICTORION-1 PREVENT - INCLISIRAN 300 MG/1.5 ML OR PLACEBO SQ INJECTION (PI-STUCKEY)
300.0000 mg | INJECTION | SUBCUTANEOUS | Status: AC
  Administered 2023-09-28: 300 mg via SUBCUTANEOUS
  Filled 2023-09-28: qty 1.5

## 2023-09-28 NOTE — Research (Addendum)
 DATE: 28-Sep-2023  SUBJECT ID: [id number]   Visit: V4  Prior or concomitant medications reviewed [x]   Lipid lowering medications reviewed [x]   Surgical history reviewed [x]   TIME: BP: PULSE:  0900 127/75 57  0901 117/77 53  0902 125/65 52  Mean BP: 123/72   Mean Pulse: 54  Labs collected at: 0909 Fasting Lipid Profile [x]  Fasting Lp(a) [x]  Liver function tests []  Exploratory biomarkers []   AE/SAE assessment completed [x]   Study drug administered [x]   Endpoint assessment completed [x]     Mr Rylee is here for V4 of V1P. He reports no abd pain, no visits to the Ed or urgent care,does report no longer taking meloxicam .  Injection was given in left lower abd at 0916 tol well Kit number [id number]. Scheduled next visit for Dec 15 at 0900.   Current Outpatient Medications:    acetaminophen  (TYLENOL  8 HOUR ARTHRITIS PAIN) 650 MG CR tablet, Take 1,300 mg by mouth every 8 (eight) hours as needed for pain., Disp: , Rfl:    amLODipine  (NORVASC ) 10 MG tablet, TAKE 1 TABLET BY MOUTH EVERY DAY, Disp: 90 tablet, Rfl: 1   aspirin  EC 81 MG tablet, Take 81 mg by mouth daily., Disp: , Rfl:    atorvastatin  (LIPITOR) 40 MG tablet, TAKE 1 TABLET BY MOUTH EVERY DAY, Disp: 90 tablet, Rfl: 0   colchicine  0.6 MG tablet, Take 1 tablet by mouth daily as needed. , Disp: , Rfl:    furosemide  (LASIX ) 20 MG tablet, TAKE 1 TABLET BY MOUTH EVERY DAY, Disp: 90 tablet, Rfl: 1   losartan  (COZAAR ) 100 MG tablet, Take 100 mg by mouth daily., Disp: , Rfl:    metoprolol  succinate (TOPROL -XL) 50 MG 24 hr tablet, TAKE 1 TABLET BY MOUTH EVERY DAY, Disp: 90 tablet, Rfl: 3   sildenafil (REVATIO) 20 MG tablet, Take 20 mg by mouth as needed., Disp: , Rfl:    Study - VICTORION-1 PREVENT - inclisiran 300 mg/1.5mL or placebo SQ injection (PI-Stuckey), Inject 300 mg into the skin every 6 (six) months. For Investigational Use Only. Inject subcutaneously into the abdomen, upper arm or thigh every 6 months in clinic. Do not  inject into areas of active skin disease,such as sun burns, skin rashes, inflammation, or skin infections. Please contact [facility] for any questions or concerns regarding this medication., Disp: , Rfl:    meloxicam  (MOBIC ) 15 MG tablet, TAKE 1 TABLET (15 MG TOTAL) BY MOUTH DAILY. (Patient not taking: Reported on 09/28/2023), Disp: 30 tablet, Rfl: 3  Current Facility-Administered Medications:    Study - VICTORION-1 PREVENT - inclisiran 300 mg/1.5mL or placebo SQ injection (PI-Stuckey), 300 mg, Subcutaneous, Q6 months, Sheryle Donning, MD, 300 mg at 10/06/22 0930   Study - VICTORION-1 PREVENT - inclisiran 300 mg/1.5mL or placebo SQ injection (PI-Stuckey), 300 mg, Subcutaneous, Q6 months, , 300 mg at 09/28/23 [phone]

## 2023-11-04 ENCOUNTER — Other Ambulatory Visit: Payer: Self-pay | Admitting: Physician Assistant

## 2023-11-27 ENCOUNTER — Other Ambulatory Visit: Payer: Self-pay | Admitting: Internal Medicine

## 2023-11-28 ENCOUNTER — Other Ambulatory Visit: Payer: Self-pay | Admitting: Internal Medicine

## 2023-12-15 ENCOUNTER — Encounter: Payer: Self-pay | Admitting: Internal Medicine

## 2023-12-15 ENCOUNTER — Ambulatory Visit: Attending: Internal Medicine | Admitting: Internal Medicine

## 2023-12-15 VITALS — BP 136/70 | HR 65 | Ht 68.0 in | Wt 215.8 lb

## 2023-12-15 DIAGNOSIS — I422 Other hypertrophic cardiomyopathy: Secondary | ICD-10-CM

## 2023-12-15 DIAGNOSIS — N281 Cyst of kidney, acquired: Secondary | ICD-10-CM | POA: Insufficient documentation

## 2023-12-15 DIAGNOSIS — I7121 Aneurysm of the ascending aorta, without rupture: Secondary | ICD-10-CM

## 2023-12-15 DIAGNOSIS — I10 Essential (primary) hypertension: Secondary | ICD-10-CM

## 2023-12-15 DIAGNOSIS — E782 Mixed hyperlipidemia: Secondary | ICD-10-CM | POA: Diagnosis not present

## 2023-12-15 NOTE — Patient Instructions (Signed)
 Medication Instructions:  Your physician recommends that you continue on your current medications as directed. Please refer to the Current Medication list given to you today.    *If you need a refill on your cardiac medications before your next appointment, please call your pharmacy*  Lab Work: Your provider would like for you to return prior to CTA Aorta to have the following labs drawn: BMP.   Please go to [facility] [address] ([facility] [address], [place] [address] You do not need an appointment.  They are open from 8 am- 4:30 pm.  Lunch from 1:00 pm- 2:00 pm You will not need to be fasting.    Testing/Procedures: CT Angiography (CTA) chest/aorta in 1 year, is a special type of CT scan that uses a computer to produce multi-dimensional views of major blood vessels throughout the body. In CT angiography, a contrast material is injected through an IV to help visualize the blood vessels  Nothing to eat or drink 4 hours prior to test  [facility] [address]  [place], [place] [address]   Follow-Up: At [facility], you and your health needs are our priority.  As part of our continuing mission to provide you with exceptional heart care, our providers are all part of one team.  This team includes your primary Cardiologist (physician) and Advanced Practice Providers or APPs (Physician Assistants and Nurse Practitioners) who all work together to provide you with the care you need, when you need it.  Your next appointment:   1 year(s)  Provider:   You may see Lonni Hanson, MD or one of the following Advanced Practice Providers on your designated Care Team:   Lonni Meager, NP Lesley Maffucci, PA-C Bernardino Bring, PA-C Cadence Lowell, PA-C Tylene Lunch, NP Barnie Hila, NP

## 2023-12-15 NOTE — Progress Notes (Signed)
 Cardiology Office Note:  .   Date:  12/15/2023  ID:  Brady Johnson, DOB 1961-02-07, MRN [id number] PCP: Johnson Morna FALCON, NP  [facility] Providers Cardiologist:  Lonni Hanson, MD Electrophysiologist:  Elspeth Sage, MD     History of Present Illness: .   Brady Johnson is a 63 y.o. male with history of hypertrophic cardiomyopathy (previously seen by genetics and EP), hypertension, hyperlipidemia, obstructive sleep apnea, and gout, who presents for follow-up of hypertrophic cardiomyopathy.  I last saw him in February, at which time he reported feeling similar to prior visits.  He endorsed mild exertional dyspnea with strenuous activities like mowing the yard, but felt like he was not restricted by his shortness of breath.  He also reported brief self-limited palpitations without associated symptoms.  Renal ultrasound was ordered for further evaluation of cystic renal lesions noted on previous CTA of the chest.  Ultrasound demonstrated 2 simple cysts within the right kidney.  Today, Brady Johnson reports that he has been doing fairly well.  His exertional dyspnea has improved with regular moderate intensity exercise at the gym.  He denies chest pain, palpitations, lightheadedness, syncope, and edema.  Home blood pressures are typically 128-130/60-70.  He is scheduled for routine follow-up with his PCP next month in the [facility].  He anticipates labs being drawn at that time.  ROS: See HPI  Studies Reviewed: SABRA   EKG Interpretation Date/Time:  Wednesday December 15 2023 09:49:36 EDT Ventricular Rate:  65 PR Interval:  128 QRS Duration:  92 QT Interval:  452 QTC Calculation: 470 R Axis:   -3  Text Interpretation: Normal sinus rhythm Possible Left atrial enlargement Left ventricular hypertrophy with repolarization abnormality Prolonged QT When compared with ECG of 19-May-2023 11:26, No significant change was found Confirmed by Clark Cuff, Lonni [phone]) on  12/15/2023 10:59:00 AM    Risk Assessment/Calculations:             Physical Exam:   VS:  BP 136/70   Pulse 65   Ht 5' 8 (1.727 m)   Wt 215 lb 12.8 oz (97.9 kg)   SpO2 96%   BMI 32.81 kg/m    Wt Readings from Last 3 Encounters:  12/15/23 215 lb 12.8 oz (97.9 kg)  09/28/23 214 lb 4.6 oz (97.2 kg)  05/19/23 212 lb (96.2 kg)    General:  NAD. Neck: No JVD or HJR. Lungs: Clear to auscultation bilaterally without wheezes or crackles. Heart: Regular rate and rhythm with 2/6 systolic murmur. Abdomen: Soft, nontender, nondistended. Extremities: No lower extremity edema.  ASSESSMENT AND PLAN: .    Hypertrophic cardiomyopathy: Brady Johnson appears euvolemic with stable NYHA class II symptoms.  We will continue his current doses of metoprolol  and furosemide  as well as ongoing follow-up with our research team.  Thoracic aortic aneurysm: Mild dilation of ascending aorta again noted on CTA of the chest in 01/2023, which has been fairly stable for the last couple of years.  We have agreed to repeat a CTA chest shortly before our follow-up visit in 1 year.  Continue atorvastatin  and aspirin .  Hypertension: Blood pressure borderline elevated today, typically better at home.  Continue current regimen of amlodipine , losartan , and metoprolol  succinate.  Hyperlipidemia: Continue atorvastatin  40 mg daily for target LDL less than 100, ideally below 70.  Follow-up labs anticipated when Brady Johnson sees his PCP next month.  Renal cysts: Incidentally noted on prior CTA of the chest with subsequent renal ultrasound confirming simple cysts.  No further workup needed.       Dispo: Return to clinic in 1 year with CTA chest shortly before visit.  Signed, Lonni Hanson, MD

## 2023-12-22 ENCOUNTER — Other Ambulatory Visit: Payer: Self-pay | Admitting: Internal Medicine

## 2024-02-03 ENCOUNTER — Other Ambulatory Visit: Payer: Self-pay | Admitting: Internal Medicine

## 2024-02-04 ENCOUNTER — Ambulatory Visit: Admission: RE | Admit: 2024-02-04 | Source: Ambulatory Visit

## 2024-03-27 ENCOUNTER — Encounter: Admitting: *Deleted

## 2024-03-27 VITALS — BP 123/75 | HR 54 | Temp 98.1°F | Resp 18 | Wt 215.1 lb

## 2024-03-27 DIAGNOSIS — Z006 Encounter for examination for normal comparison and control in clinical research program: Secondary | ICD-10-CM

## 2024-03-27 MED ORDER — STUDY - VICTORION-1 PREVENT - INCLISIRAN 300 MG/1.5 ML OR PLACEBO SQ INJECTION (PI-STUCKEY)
300.0000 mg | INJECTION | SUBCUTANEOUS | Status: AC
  Administered 2024-03-27: 09:00:00 300 mg via SUBCUTANEOUS
  Filled 2024-03-27: qty 1.5

## 2024-03-27 NOTE — Research (Addendum)
° ° °  V1P Study Drug Administration  Subject ID #: [id number] Kit #: [id number] Lot #: [id number] Administration Site: left lower abd  DATE: 27-Mar-2024  Visit: 5  Prior or concomitant medications reviewed [x]   Lipid lowering medications reviewed [x]   Surgical history reviewed [x]   TIME: BP: PULSE:  0835 140/77 54  0847 129/73 54  0851 123/75 54  Mean BP: 130/75  Mean Pulse: 54  Labs collected at:  Fasting Lipid Profile [x]  Fasting Lp(a) []  Liver function tests []  Exploratory biomarkers []   AE/SAE assessment completed [x]   Study drug administered [x]   Endpoint assessment completed [x]   Brady Johnson is here for Visit 5 of V1P. He reports no abd pain, no changes in his meds, and no visits to the ed or urgent care since seen last. Scheduled next visit for June 15 at 0900. Injection given in left lower abd at 0905 tol well kit number [id number].lifestyle instructions reviewed.    Current Medications[1]      [1]  Current Outpatient Medications:    amLODipine  (NORVASC ) 10 MG tablet, TAKE 1 TABLET BY MOUTH EVERY DAY, Disp: 90 tablet, Rfl: 3   aspirin  EC 81 MG tablet, Take 81 mg by mouth daily., Disp: , Rfl:    atorvastatin  (LIPITOR) 40 MG tablet, TAKE 1 TABLET BY MOUTH EVERY DAY, Disp: 90 tablet, Rfl: 3   colchicine  0.6 MG tablet, Take 1 tablet by mouth daily as needed. , Disp: , Rfl:    furosemide  (LASIX ) 20 MG tablet, TAKE 1 TABLET BY MOUTH EVERY DAY. NEED APPT FOR REFILLS., Disp: 90 tablet, Rfl: 1   losartan  (COZAAR ) 100 MG tablet, Take 100 mg by mouth daily., Disp: , Rfl:    metoprolol  succinate (TOPROL -XL) 50 MG 24 hr tablet, TAKE 1 TABLET BY MOUTH EVERY DAY, Disp: 90 tablet, Rfl: 3   sildenafil (REVATIO) 20 MG tablet, Take 20 mg by mouth as needed., Disp: , Rfl:    Study - VICTORION-1 PREVENT - inclisiran 300 mg/1.5mL or placebo SQ injection (PI-Stuckey), Inject 300 mg into the skin every 6 (six) months. For Investigational Use Only. Inject subcutaneously into the abdomen, upper  arm or thigh every 6 months in clinic. Do not inject into areas of active skin disease,such as sun burns, skin rashes, inflammation, or skin infections. Please contact [facility] for any questions or concerns regarding this medication., Disp: , Rfl:    acetaminophen  (TYLENOL  8 HOUR ARTHRITIS PAIN) 650 MG CR tablet, Take 1,300 mg by mouth every 8 (eight) hours as needed for pain. (Patient not taking: Reported on 03/27/2024), Disp: , Rfl:    meloxicam  (MOBIC ) 15 MG tablet, TAKE 1 TABLET (15 MG TOTAL) BY MOUTH DAILY. (Patient not taking: Reported on 03/27/2024), Disp: 30 tablet, Rfl: 3  Current Facility-Administered Medications:    Study - VICTORION-1 PREVENT - inclisiran 300 mg/1.5mL or placebo SQ injection (PI-Stuckey), 300 mg, Subcutaneous, Q6 months, Lonni Slain, MD, 300 mg at 10/06/22 0930   Study - VICTORION-1 PREVENT - inclisiran 300 mg/1.5mL or placebo SQ injection (PI-Stuckey), 300 mg, Subcutaneous, Q6 months, , 300 mg at 09/28/23 9083   Study - VICTORION-1 PREVENT - inclisiran 300 mg/1.5mL or placebo SQ injection (PI-Stuckey), 300 mg, Subcutaneous, Q6 months, , 300 mg at 03/27/24 [phone]

## 2024-09-25 ENCOUNTER — Encounter
